# Patient Record
Sex: Female | Born: 1955
Health system: Southern US, Community
[De-identification: ages and names within clinical notes are randomized; demographics above are authoritative.]

## PROBLEM LIST (undated history)

## (undated) DIAGNOSIS — I10 Essential (primary) hypertension: Secondary | ICD-10-CM

## (undated) DIAGNOSIS — B019 Varicella without complication: Secondary | ICD-10-CM

## (undated) HISTORY — PX: LAPAROSCOPIC HYSTERECTOMY: SHX1926

## (undated) HISTORY — DX: Essential (primary) hypertension: I10

## (undated) HISTORY — DX: Varicella without complication: B01.9

---

## 2003-12-19 ENCOUNTER — Emergency Department: Payer: Self-pay | Admitting: Emergency Medicine

## 2007-10-30 ENCOUNTER — Emergency Department: Payer: Self-pay | Admitting: Emergency Medicine

## 2008-02-18 ENCOUNTER — Emergency Department: Payer: Self-pay | Admitting: Emergency Medicine

## 2008-02-18 IMAGING — CR DG CHEST 2V
1 series · 3 of 3 positions shown · non-contrast
Comparison: none

COMMENTS:
REASON FOR EXAM: Chest pain

[Series 1: view not recorded · 0.17mm/px · 3 of 3 slices shown]
[im 1/3]
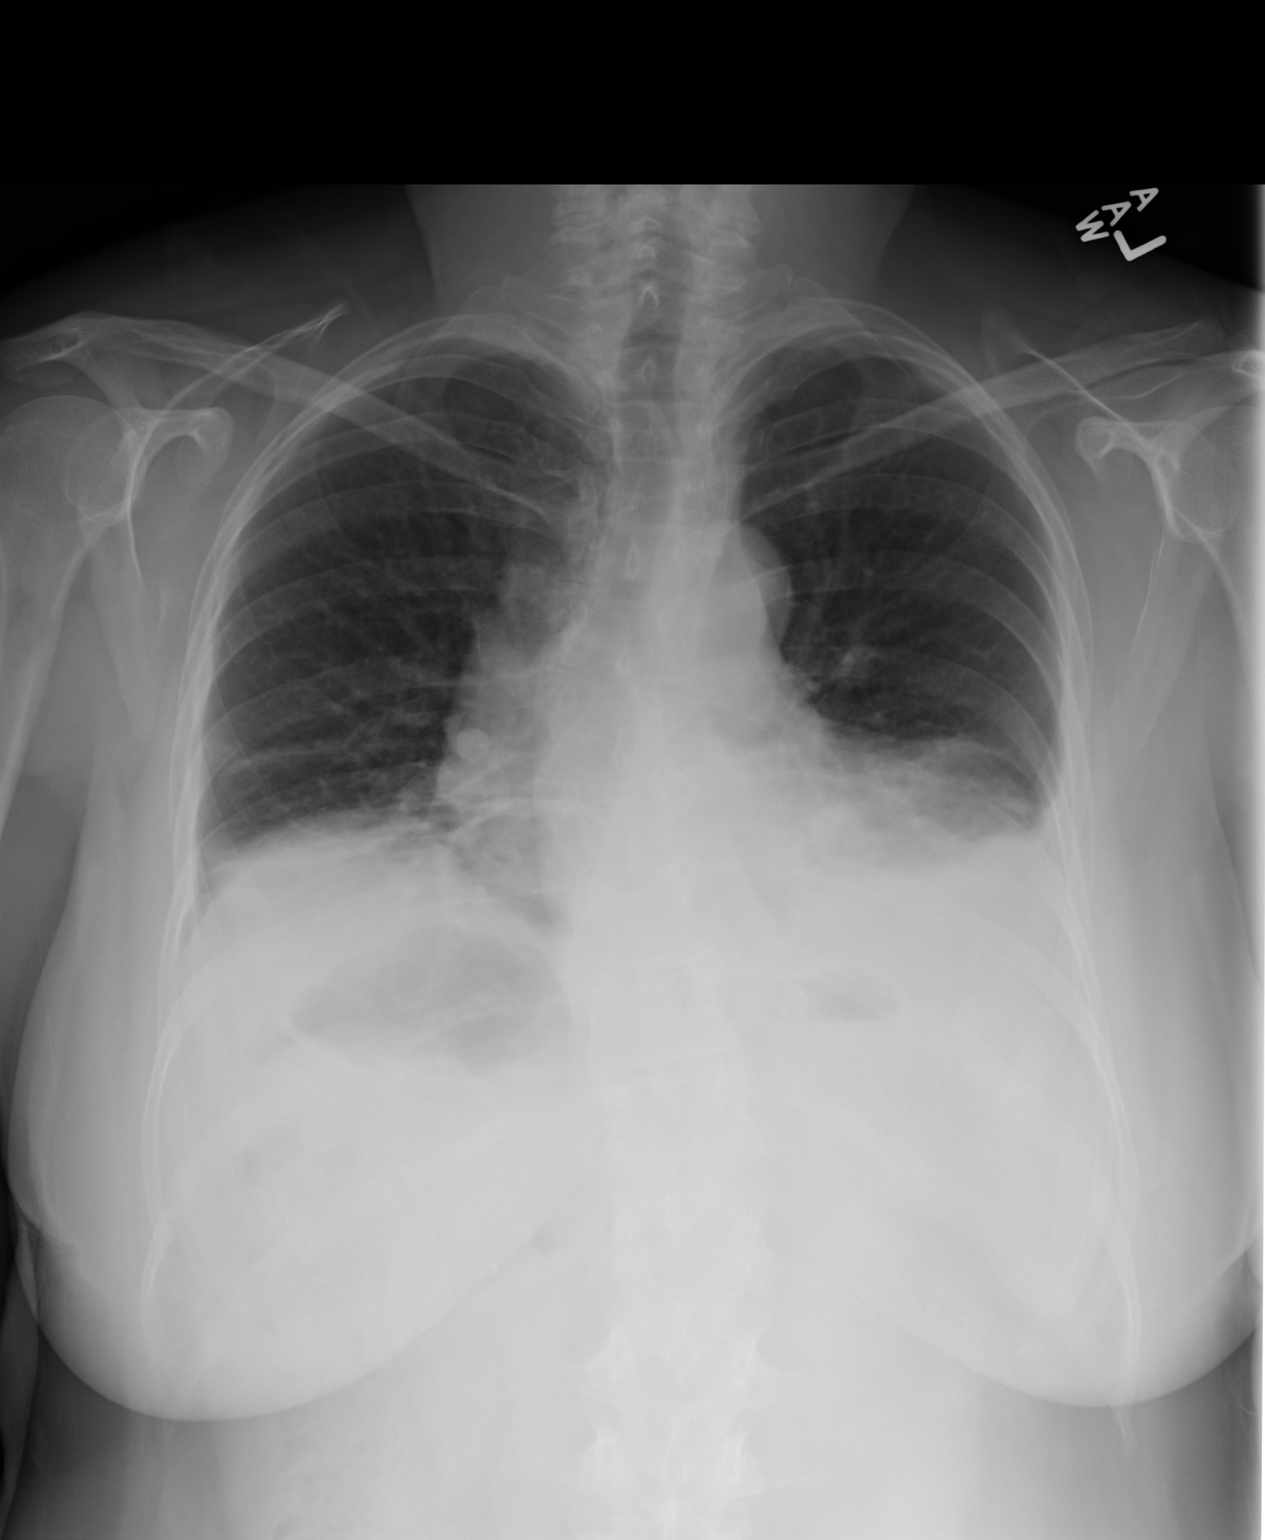
[im 2/3]
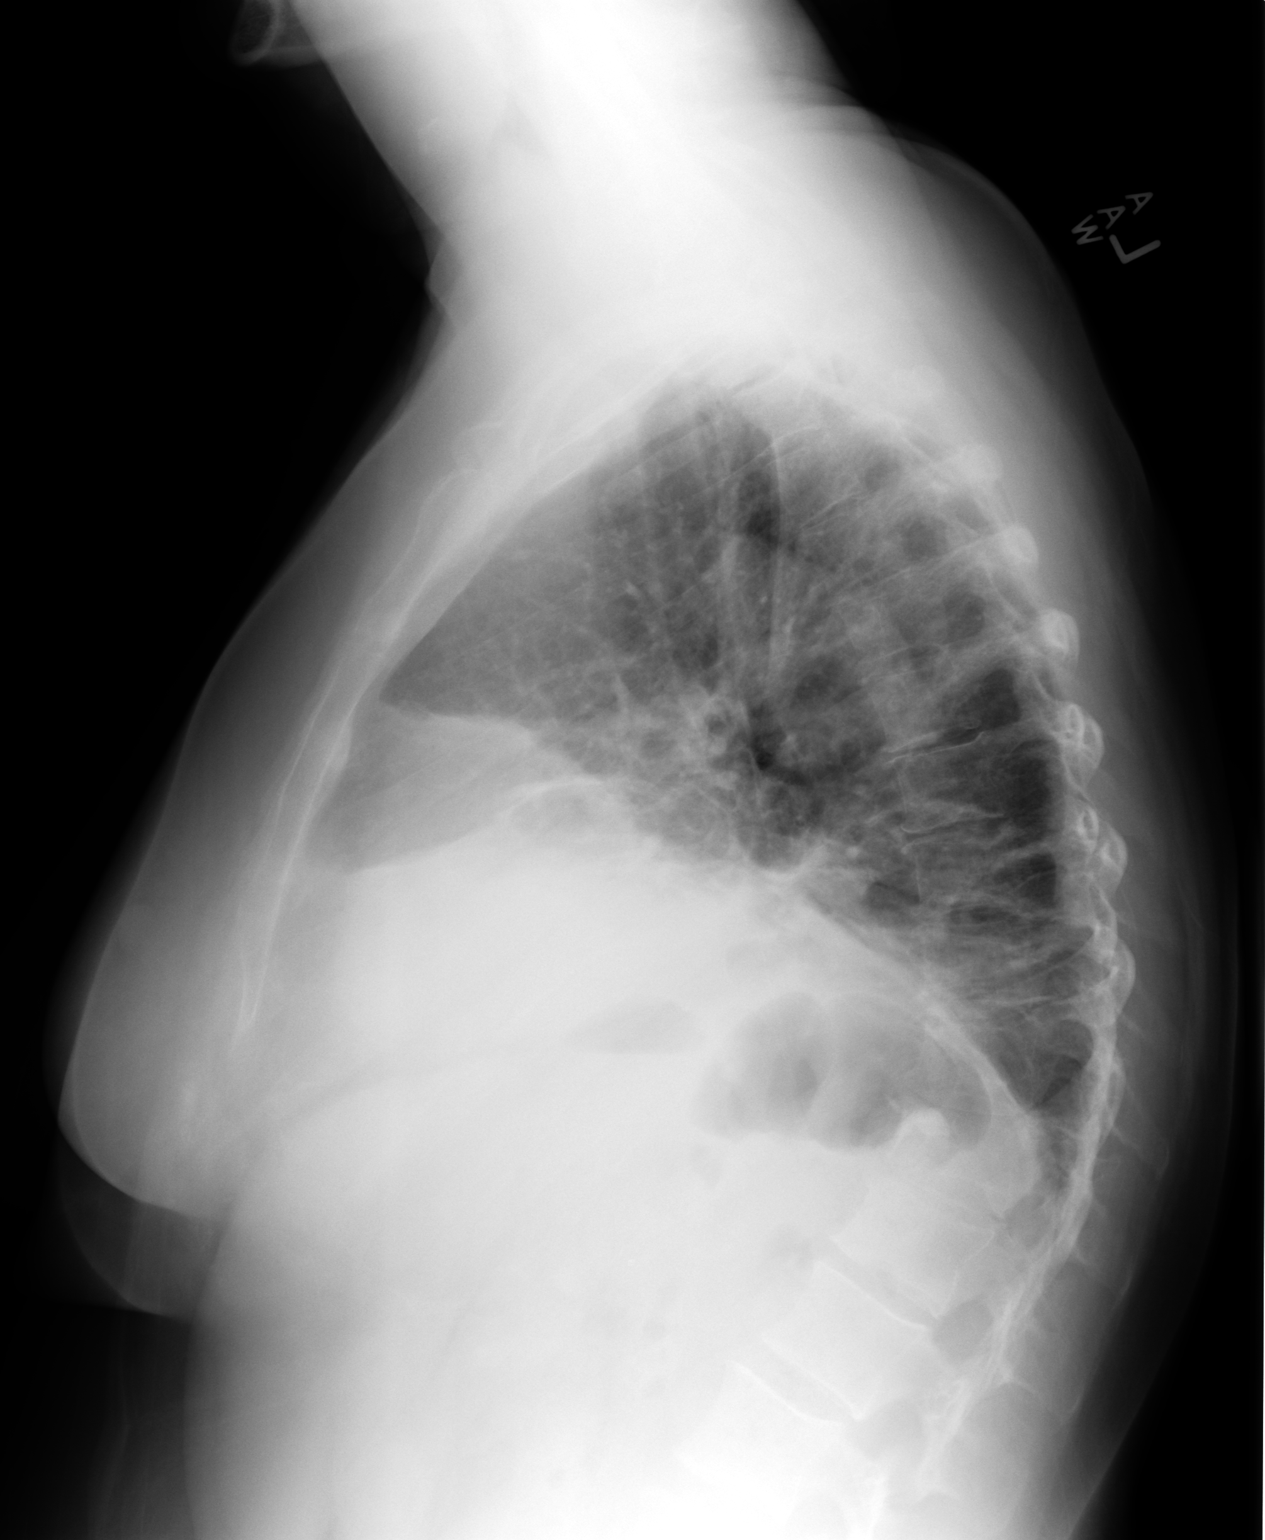
[im 3/3]
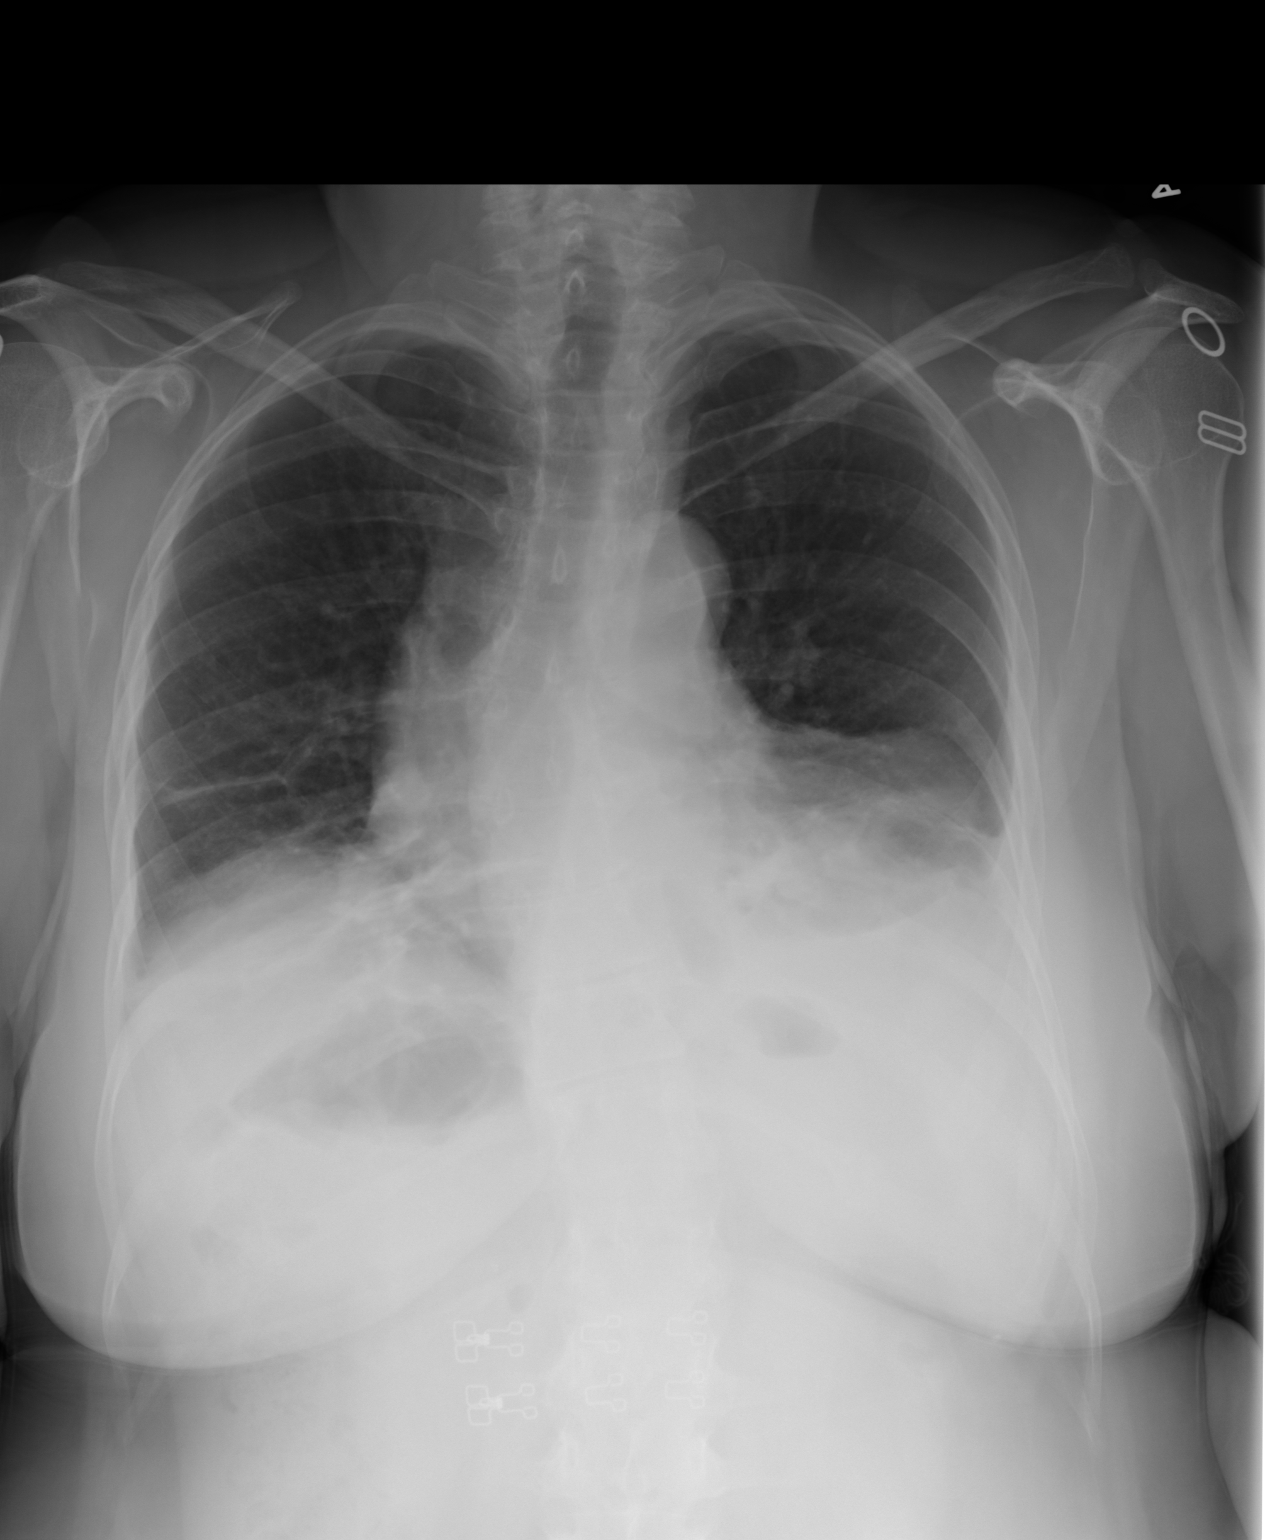

[3 of 3 positions shown; findings below may reference images not displayed]

PROCEDURE:          DXR CHEST PA (OR AP) AND LATERAL [DATE] [DATE]

RESULT:      Comparison is made to the prior exam of [DATE].  There is
increased density at the lung bases bilaterally, more prominent on the LEFT.
The findings are suspicious for bibasilar pneumonia. Associated atelectasis
or a trace LEFT effusion may be present. The heart is enlarged but stable in
size as compared to the prior exam. No interstitial or pulmonary edema is
seen.
IMPRESSION: 1. There are bibasilar infiltrates, more prominent on the LEFT and
compatible with pneumonia and/or atelectasis.
2. Possible small LEFT pleural effusion.
3. Stable cardiomegaly.

## 2008-04-21 ENCOUNTER — Ambulatory Visit: Payer: Self-pay | Admitting: Internal Medicine

## 2010-08-04 ENCOUNTER — Emergency Department: Payer: Self-pay | Admitting: Unknown Physician Specialty

## 2012-10-22 ENCOUNTER — Ambulatory Visit: Payer: Self-pay | Admitting: General Practice

## 2014-11-16 ENCOUNTER — Ambulatory Visit: Payer: Self-pay | Admitting: Family

## 2014-11-16 ENCOUNTER — Encounter: Payer: Self-pay | Admitting: Physician Assistant

## 2014-11-16 VITALS — BP 180/100 | Temp 98.1°F | Wt 162.0 lb

## 2014-11-16 DIAGNOSIS — I10 Essential (primary) hypertension: Secondary | ICD-10-CM

## 2014-11-16 MED ORDER — LOSARTAN POTASSIUM 100 MG PO TABS: 100.0000 mg | ORAL_TABLET | Freq: Every day | ORAL | Status: DC

## 2014-11-16 NOTE — Progress Notes (Signed)
S/ 59 y/o WM referred from benefit [facility] for elevated BP ,states it was  220/120 . She is asx. H/O HTN and not  Compliant to meds, Was seen here in clinic 2012 for HTN and  rx of HCTZ 12.5 She was taken OOW . Also reports 7 yrs ago she was at work with acute onset of visual loss  "like a curtain " in R eye . Lasted 10 minutes. She continued working and had no follow up. Family Hx of HTN and DM Has not had annual eye exam or mammogram since she was 39 .   O/ alert pleasant  NAD  Neck supple without bruits Heart RSR without murmur Lungs clear . Extremities No edema  A/ Hypertension P Cozarr 100 mg one daily.  Referred to [facility] appt 10/12 at 10:30 .  She is encouraged to schedule eye exam and mammogram  And establish care.

## 2014-11-16 NOTE — Patient Instructions (Signed)
Hypertension Hypertension, commonly called high blood pressure, is when the force of blood pumping through your arteries is too strong. Your arteries are the blood vessels that carry blood from your heart throughout your body. A blood pressure reading consists of a higher number over a lower number, such as 110/72. The higher number (systolic) is the pressure inside your arteries when your heart pumps. The lower number (diastolic) is the pressure inside your arteries when your heart relaxes. Ideally you want your blood pressure below 120/80. Hypertension forces your heart to work harder to pump blood. Your arteries may become narrow or stiff. Having untreated or uncontrolled hypertension can cause heart attack, stroke, kidney disease, and other problems. RISK FACTORS Some risk factors for high blood pressure are controllable. Others are not.  Risk factors you cannot control include:   Race. You may be at higher risk if you are African American.  Age. Risk increases with age.  Gender. Men are at higher risk than women before age 45 years. After age 65, women are at higher risk than men. Risk factors you can control include:  Not getting enough exercise or physical activity.  Being overweight.  Getting too much fat, sugar, calories, or salt in your diet.  Drinking too much alcohol. SIGNS AND SYMPTOMS Hypertension does not usually cause signs or symptoms. Extremely high blood pressure (hypertensive crisis) may cause headache, anxiety, shortness of breath, and nosebleed. DIAGNOSIS To check if you have hypertension, your health care provider will measure your blood pressure while you are seated, with your arm held at the level of your heart. It should be measured at least twice using the same arm. Certain conditions can cause a difference in blood pressure between your right and left arms. A blood pressure reading that is higher than normal on one occasion does not mean that you need treatment. If  it is not clear whether you have high blood pressure, you may be asked to return on a different day to have your blood pressure checked again. Or, you may be asked to monitor your blood pressure at home for 1 or more weeks. TREATMENT Treating high blood pressure includes making lifestyle changes and possibly taking medicine. Living a healthy lifestyle can help lower high blood pressure. You may need to change some of your habits. Lifestyle changes may include:  Following the DASH diet. This diet is high in fruits, vegetables, and whole grains. It is low in salt, red meat, and added sugars.  Keep your sodium intake below 2,300 mg per day.  Getting at least 30-45 minutes of aerobic exercise at least 4 times per week.  Losing weight if necessary.  Not smoking.  Limiting alcoholic beverages.  Learning ways to reduce stress. Your health care provider may prescribe medicine if lifestyle changes are not enough to get your blood pressure under control, and if one of the following is true:  You are 18-59 years of age and your systolic blood pressure is above 140.  You are 60 years of age or older, and your systolic blood pressure is above 150.  Your diastolic blood pressure is above 90.  You have diabetes, and your systolic blood pressure is over 140 or your diastolic blood pressure is over 90.  You have kidney disease and your blood pressure is above 140/90.  You have heart disease and your blood pressure is above 140/90. Your personal target blood pressure may vary depending on your medical conditions, your age, and other factors. HOME CARE INSTRUCTIONS    Have your blood pressure rechecked as directed by your health care provider.   Take medicines only as directed by your health care provider. Follow the directions carefully. Blood pressure medicines must be taken as prescribed. The medicine does not work as well when you skip doses. Skipping doses also puts you at risk for  problems.  Do not smoke.   Monitor your blood pressure at home as directed by your health care provider. SEEK MEDICAL CARE IF:   You think you are having a reaction to medicines taken.  You have recurrent headaches or feel dizzy.  You have swelling in your ankles.  You have trouble with your vision. SEEK IMMEDIATE MEDICAL CARE IF:  You develop a severe headache or confusion.  You have unusual weakness, numbness, or feel faint.  You have severe chest or abdominal pain.  You vomit repeatedly.  You have trouble breathing. MAKE SURE YOU:   Understand these instructions.  Will watch your condition.  Will get help right away if you are not doing well or get worse.   This information is not intended to replace advice given to you by your health care provider. Make sure you discuss any questions you have with your health care provider.   Document Released: 01/23/2005 Document Revised: 06/09/2014 Document Reviewed: 11/15/2012 Elsevier Interactive Patient Education 2016 Elsevier Inc. DASH Eating Plan DASH stands for "Dietary Approaches to Stop Hypertension." The DASH eating plan is a healthy eating plan that has been shown to reduce high blood pressure (hypertension). Additional health benefits may include reducing the risk of type 2 diabetes mellitus, heart disease, and stroke. The DASH eating plan may also help with weight loss. WHAT DO I NEED TO KNOW ABOUT THE DASH EATING PLAN? For the DASH eating plan, you will follow these general guidelines:  Choose foods with a percent daily value for sodium of less than 5% (as listed on the food label).  Use salt-free seasonings or herbs instead of table salt or sea salt.  Check with your health care provider or pharmacist before using salt substitutes.  Eat lower-sodium products, often labeled as "lower sodium" or "no salt added."  Eat fresh foods.  Eat more vegetables, fruits, and low-fat dairy products.  Choose whole grains.  Look for the word "whole" as the first word in the ingredient list.  Choose fish and skinless chicken or turkey more often than red meat. Limit fish, poultry, and meat to 6 oz (170 g) each day.  Limit sweets, desserts, sugars, and sugary drinks.  Choose heart-healthy fats.  Limit cheese to 1 oz (28 g) per day.  Eat more home-cooked food and less restaurant, buffet, and fast food.  Limit fried foods.  Cook foods using methods other than frying.  Limit canned vegetables. If you do use them, rinse them well to decrease the sodium.  When eating at a restaurant, ask that your food be prepared with less salt, or no salt if possible. WHAT FOODS CAN I EAT? Seek help from a dietitian for individual calorie needs. Grains Whole grain or whole wheat bread. Brown rice. Whole grain or whole wheat pasta. Quinoa, bulgur, and whole grain cereals. Low-sodium cereals. Corn or whole wheat flour tortillas. Whole grain cornbread. Whole grain crackers. Low-sodium crackers. Vegetables Fresh or frozen vegetables (raw, steamed, roasted, or grilled). Low-sodium or reduced-sodium tomato and vegetable juices. Low-sodium or reduced-sodium tomato sauce and paste. Low-sodium or reduced-sodium canned vegetables.  Fruits All fresh, canned (in natural juice), or frozen fruits. Meat and Other   Protein Products Ground beef (85% or leaner), grass-fed beef, or beef trimmed of fat. Skinless chicken or turkey. Ground chicken or turkey. Pork trimmed of fat. All fish and seafood. Eggs. Dried beans, peas, or lentils. Unsalted nuts and seeds. Unsalted canned beans. Dairy Low-fat dairy products, such as skim or 1% milk, 2% or reduced-fat cheeses, low-fat ricotta or cottage cheese, or plain low-fat yogurt. Low-sodium or reduced-sodium cheeses. Fats and Oils Tub margarines without trans fats. Light or reduced-fat mayonnaise and salad dressings (reduced sodium). Avocado. Safflower, olive, or canola oils. Natural peanut or almond  butter. Other Unsalted popcorn and pretzels. The items listed above may not be a complete list of recommended foods or beverages. Contact your dietitian for more options. WHAT FOODS ARE NOT RECOMMENDED? Grains White bread. White pasta. White rice. Refined cornbread. Bagels and croissants. Crackers that contain trans fat. Vegetables Creamed or fried vegetables. Vegetables in a cheese sauce. Regular canned vegetables. Regular canned tomato sauce and paste. Regular tomato and vegetable juices. Fruits Dried fruits. Canned fruit in light or heavy syrup. Fruit juice. Meat and Other Protein Products Fatty cuts of meat. Ribs, chicken wings, bacon, sausage, bologna, salami, chitterlings, fatback, hot dogs, bratwurst, and packaged luncheon meats. Salted nuts and seeds. Canned beans with salt. Dairy Whole or 2% milk, cream, half-and-half, and cream cheese. Whole-fat or sweetened yogurt. Full-fat cheeses or blue cheese. Nondairy creamers and whipped toppings. Processed cheese, cheese spreads, or cheese curds. Condiments Onion and garlic salt, seasoned salt, table salt, and sea salt. Canned and packaged gravies. Worcestershire sauce. Tartar sauce. Barbecue sauce. Teriyaki sauce. Soy sauce, including reduced sodium. Steak sauce. Fish sauce. Oyster sauce. Cocktail sauce. Horseradish. Ketchup and mustard. Meat flavorings and tenderizers. Bouillon cubes. Hot sauce. Tabasco sauce. Marinades. Taco seasonings. Relishes. Fats and Oils Butter, stick margarine, lard, shortening, ghee, and bacon fat. Coconut, palm kernel, or palm oils. Regular salad dressings. Other Pickles and olives. Salted popcorn and pretzels. The items listed above may not be a complete list of foods and beverages to avoid. Contact your dietitian for more information. WHERE CAN I FIND MORE INFORMATION? National Heart, Lung, and Blood Institute: www.nhlbi.nih.gov/health/health-topics/topics/dash/   This information is not intended to replace  advice given to you by your health care provider. Make sure you discuss any questions you have with your health care provider.   Document Released: 01/12/2011 Document Revised: 02/13/2014 Document Reviewed: 11/27/2012 Elsevier Interactive Patient Education 2016 Elsevier Inc.  

## 2014-11-18 ENCOUNTER — Encounter: Payer: Self-pay | Admitting: Family Medicine

## 2014-11-18 ENCOUNTER — Ambulatory Visit (INDEPENDENT_AMBULATORY_CARE_PROVIDER_SITE_OTHER): Payer: 59 | Admitting: Family Medicine

## 2014-11-18 VITALS — BP 168/98 | HR 74 | Temp 98.6°F | Ht 61.81 in | Wt 162.8 lb

## 2014-11-18 DIAGNOSIS — G44209 Tension-type headache, unspecified, not intractable: Secondary | ICD-10-CM | POA: Diagnosis not present

## 2014-11-18 DIAGNOSIS — R1909 Other intra-abdominal and pelvic swelling, mass and lump: Secondary | ICD-10-CM | POA: Diagnosis not present

## 2014-11-18 DIAGNOSIS — R1310 Dysphagia, unspecified: Secondary | ICD-10-CM | POA: Diagnosis not present

## 2014-11-18 DIAGNOSIS — R51 Headache: Secondary | ICD-10-CM

## 2014-11-18 DIAGNOSIS — I10 Essential (primary) hypertension: Secondary | ICD-10-CM | POA: Diagnosis not present

## 2014-11-18 DIAGNOSIS — R519 Headache, unspecified: Secondary | ICD-10-CM | POA: Insufficient documentation

## 2014-11-18 LAB — BASIC METABOLIC PANEL
BUN: 21 mg/dL (ref 6–23)
CALCIUM: 9.8 mg/dL (ref 8.4–10.5)
CO2: 28 meq/L (ref 19–32)
Chloride: 105 mEq/L (ref 96–112)
Creatinine, Ser: 0.68 mg/dL (ref 0.40–1.20)
GFR: 93.7 mL/min (ref >60.00)
GLUCOSE: 89 mg/dL (ref 70–99)
Potassium: 4.2 mEq/L (ref 3.5–5.1)
SODIUM: 141 meq/L (ref 135–145)

## 2014-11-18 MED ORDER — OMEPRAZOLE 20 MG PO CPDR: 20.0000 mg | DELAYED_RELEASE_CAPSULE | Freq: Every day | ORAL | Status: DC

## 2014-11-18 NOTE — Progress Notes (Signed)
Pre visit review using our clinic review tool, if applicable. No additional management support is needed unless otherwise documented below in the visit note. 

## 2014-11-18 NOTE — Progress Notes (Signed)
Patient ID: Kathryn Cameron, female   DOB: Jun 08, 1955, 59 y.o.   MRN: [id number]  Kathryn AlarEric Sonnenberg, MD Phone: [phone]  Kathryn Cameron is a 59 y.o. female who presents today for new patient visit.  HYPERTENSION Disease Monitoring Home BP Monitoring intermittently checks at work, 150's/80's Chest pain- no    Dyspnea- no Medications Compliance-  Started on cozaar 2 days ago. Lightheadedness-  no  Edema- no Seen at employee health 11/16/14. BP had been 220/120. Came down to 180/100. Started on cozaar.  Notes she has been on HCTZ in the past, though was non-compliant. She repeatedly stated throughout the encounter that she did not like to take medications.   Headache: notes minimal dull HA for the past several days. Has had intermittent infrequent headaches in the past. None recently. Notes it is not bad and barely notes this is there. Gradual onset. Feels this is related to stress at home. No numbness or weakness. No recent vision changes. Notes 7-8 years ago had an episode where she lost vision in her right eye for 10 minutes. It came back to normal and has had no issues since that time. Did not get evaluated for this. Notes tingling in her left little and ring fingers 2 months ago for one week after holding her arm in the same position for an extended period of time. No symptoms since then.   Swallowing trouble: notes has a history of a hiatal hernia. Notes she has trouble with rice and noodles getting stuck going down. No trouble swallowing other food. Has had EGD and colonoscopy in the past that revealed a hiatal hernia. Saw Dr Alycia Rossettiyan at [facility] for this. She states he also evaluated the soft tissue mass in her right groin at that time. She notes she had an US to evaluate this and advised that the US did not show anything abnormal. She notes some intermittent diarrhea, though no constipation. Is always able to pass her bowels. The lesion is soft. Never becomes firm or red. Has grown slightly in the  past several years.    Active Ambulatory Problems    Diagnosis Date Noted  . Essential hypertension 11/18/2014  . Headache 11/18/2014  . Trouble swallowing 11/18/2014  . Mass of right inguinal region 11/18/2014   Resolved Ambulatory Problems    Diagnosis Date Noted  . No Resolved Ambulatory Problems   Past Medical History  Diagnosis Date  . Hypertension   . Chicken pox     Family History  Problem Relation Age of Onset  . Stroke Father     in his 6780's  . Hypertension      parent  . Diabetes      parent    Social History   Social History  . Marital Status: Divorced    Spouse Name: N/A  . Number of Children: N/A  . Years of Education: N/A   Occupational History  . Not on file.   Social History Main Topics  . Smoking status: Never Smoker   . Smokeless tobacco: Not on file  . Alcohol Use: No  . Drug Use: No  . Sexual Activity: Not on file   Other Topics Concern  . Not on file   Social History Narrative    ROS   General:  Negative for nexplained weight loss, fever Skin: Negative for new or changing mole, sore that won't heal HEENT: Negative for trouble hearing, trouble seeing, ringing in ears, mouth sores, hoarseness, change in voice, dysphagia. CV:  Negative  for chest pain, dyspnea, edema, palpitations Resp: Negative for cough, dyspnea, hemoptysis GI: positive for trouble swallowing, Negative for nausea, vomiting, diarrhea, constipation, abdominal pain, melena, hematochezia. GU: Negative for dysuria, incontinence, urinary hesitance, hematuria, vaginal or penile discharge, polyuria, sexual difficulty, lumps in testicle or breasts MSK: Negative for muscle cramps or aches, joint pain or swelling Neuro: positive for headaches, Negative for weakness, numbness, dizziness, passing out/fainting Psych: Negative for depression, anxiety, memory problems  Objective  Physical Exam Filed Vitals:   11/18/14 1139  BP: 168/98  Pulse:   Temp:     BP Readings from  Last 3 Encounters:  11/18/14 168/98  11/16/14 180/100   Wt Readings from Last 3 Encounters:  11/18/14 162 lb 12.8 oz (73.846 kg)  11/16/14 162 lb (73.483 kg)    Physical Exam  Constitutional: She is well-developed, well-nourished, and in no distress.  HENT:  Head: Normocephalic and atraumatic.  Right Ear: External ear normal.  Left Ear: External ear normal.  Mouth/Throat: Oropharynx is clear and moist. No oropharyngeal exudate.  Eyes: Conjunctivae are normal. Pupils are equal, round, and reactive to light.  Neck: Neck supple.  Cardiovascular: Normal rate, regular rhythm and normal heart sounds.  Exam reveals no gallop and no friction rub.   No murmur heard. Pulmonary/Chest: Effort normal and breath sounds normal. No respiratory distress. She has no wheezes. She has no rales.  Abdominal: Soft. Bowel sounds are normal. She exhibits no distension and no mass. There is no tenderness. There is no rebound and no guarding.  No pulsatile mass  Genitourinary:  Normal labia, there is evidence of small degree of anterior wall of vagina prolapse, no cervix identified in patient s/p hysterectomy, normal vaginal mucosa with no bleeding or discharge, the area of prolapse worsens with prolapse In the right inguinal area there is a large soft tissue mass noted, this does not worsen with cough, there is no palpable hernia on exam, there is no erythema or tenderness, no induration, no LAD in bilateral inguinal regions  Musculoskeletal: She exhibits no edema.  Lymphadenopathy:    She has no cervical adenopathy.  Neurological: She is alert.  CN 2-12 intact, 5/5 strength in bilateral biceps, triceps, grip, quads, hamstrings, plantar and dorsiflexion, sensation to light touch intact in bilateral UE and LE, normal gait, 2+ patellar reflexes  Skin: Skin is warm and dry. She is not diaphoretic.  Psychiatric: Mood and affect normal.     Assessment/Plan:   Essential hypertension BP elevated today. Mild  headache is only symptom. No signs of end organ damage. Suspect it has been elevated for a long time given she has not seen a physician in a number of years. Patient is hesitant to take medication. Has only been on cozaar for 2 days. Will continue to monitor on this. Will check BMET to determine if kidneys or liver has been affected. Return early next week for BP check. Given return precautions.   Headache Mild dull headache consistent with tension HA. Could be related to BP. Neurologically intact. Discussed that onset of headaches in >16 yo is concerning given that she had infrequent headaches previously. Discussed imaging to evaluate her brain, though she declined this opting to see if this would improve with blood pressure control. Given return precautions.   Trouble swallowing Patient with history of hiatal hernia and prior GI evaluation. Has continue to have issues with this. No reported weight loss. No blood in stool. Is able to swallow most things well. Concern would be for some  type of stricture or worsening of hiatal hernia. Will refer to GI for further evaluation. Start on omeprazole. Given return precautions.   Mass of right inguinal region Lesion is soft and feels consistent with lipoma. No apparent hernia on exam. No LAD. No abdominal pain. Reports daily BMs making hernia and obstruction unlikely. Reports she had a prior eval for this with [place], though the lesion has enlarged some. Pelvic exam with evidence of likely bladder prolapse as well. No apparent other abnormalities. Discussed possible causes of this. Advised that CT scan or referral to surgery for the inguinal mass and referral to GYN for the prolapse would be what I would like to do to further evaluate these issues, though she declined these stating she would like to think about this. She would prefer to monitor these things at this time. She will let us know if she decides to pursue further work up. Given return precautions.      Orders Placed This Encounter  Procedures  . Basic Metabolic Panel (BMET)  . Ambulatory referral to Gastroenterology    Referral Priority:  Routine    Referral Type:  Consultation    Referral Reason:  Specialty Services Required    Number of Visits Requested:  1    Meds ordered this encounter  Medications  . omeprazole (PRILOSEC) 20 MG capsule    Sig: Take 1 capsule (20 mg total) by mouth daily.    Dispense:  30 capsule    Refill:  3    Kathryn Alar

## 2014-11-18 NOTE — Assessment & Plan Note (Signed)
Mild dull headache consistent with tension HA. Could be related to BP. Neurologically intact. Discussed that onset of headaches in 19>60 yo is concerning given that she had infrequent headaches previously. Discussed imaging to evaluate her brain, though she declined this opting to see if this would improve with blood pressure control. Given return precautions.

## 2014-11-18 NOTE — Assessment & Plan Note (Signed)
BP elevated today. Mild headache is only symptom. No signs of end organ damage. Suspect it has been elevated for a long time given she has not seen a physician in a number of years. Patient is hesitant to take medication. Has only been on cozaar for 2 days. Will continue to monitor on this. Will check BMET to determine if kidneys or liver has been affected. Return early next week for BP check. Given return precautions.

## 2014-11-18 NOTE — Assessment & Plan Note (Signed)
Patient with history of hiatal hernia and prior GI evaluation. Has continue to have issues with this. No reported weight loss. No blood in stool. Is able to swallow most things well. Concern would be for some type of stricture or worsening of hiatal hernia. Will refer to GI for further evaluation. Start on omeprazole. Given return precautions.

## 2014-11-18 NOTE — Patient Instructions (Signed)
Nice to meet you. Please check your blood pressure daily at home.  Please continue the cozaar for your blood pressure. Please come back early next week for a nurse visit for blood pressure check. We will start you on omeprazole. We will refer you to GI.  Please consider obtaining a CT scan or seeing surgery for the mass in your groin.  Please consider seeing gynecology for your vaginal prolapse.  If you decide to do the above things please let us know.  If you have chest pain, shortness of breath, swelling, abdominal pain, nausea, vomiting, diarrhea, headache, vision changes, numbness, or weakness please seek medical attention.

## 2014-11-18 NOTE — Assessment & Plan Note (Signed)
Lesion is soft and feels consistent with lipoma. No apparent hernia on exam. No LAD. No abdominal pain. Reports daily BMs making hernia and obstruction unlikely. Reports she had a prior eval for this with US, though the lesion has enlarged some. Pelvic exam with evidence of likely bladder prolapse as well. No apparent other abnormalities. Discussed possible causes of this. Advised that CT scan or referral to surgery for the inguinal mass and referral to GYN for the prolapse would be what I would like to do to further evaluate these issues, though she declined these stating she would like to think about this. She would prefer to monitor these things at this time. She will let us know if she decides to pursue further work up. Given return precautions.

## 2014-11-26 ENCOUNTER — Ambulatory Visit (INDEPENDENT_AMBULATORY_CARE_PROVIDER_SITE_OTHER): Payer: 59

## 2014-11-26 VITALS — BP 130/78 | HR 78 | Resp 20

## 2014-11-26 DIAGNOSIS — I10 Essential (primary) hypertension: Secondary | ICD-10-CM | POA: Diagnosis not present

## 2014-11-26 MED ORDER — LOSARTAN POTASSIUM 100 MG PO TABS: 100.0000 mg | ORAL_TABLET | Freq: Every day | ORAL | Status: DC

## 2014-11-26 NOTE — Progress Notes (Signed)
Blood pressure is at goal today. Will continue to monitor. If her blood pressure continues to be >140 SBP at work she should inform the office. She should check her blood pressure after she has been resting for 10-15 minutes. I will refill her losartan. She should come back in for another BMET to ensure her kidney function is stable on the losartan.

## 2014-11-26 NOTE — Addendum Note (Signed)
Addended by: Birdie SonsSONNENBERG, Chandrea Zellman G on: 11/26/2014 01:54 PM   Modules accepted: Orders

## 2014-11-26 NOTE — Progress Notes (Signed)
Patient came in for BP recheck.  Has been checking her BP at work and getting 130-150's SBP.  Checked BP in office in bilateral extremities.    Patient needs a refill on her Losartan Potassium 100mg  90 day supply, [facility]  Please advise?

## 2015-04-08 ENCOUNTER — Other Ambulatory Visit: Payer: Self-pay | Admitting: *Deleted

## 2015-04-08 DIAGNOSIS — I10 Essential (primary) hypertension: Secondary | ICD-10-CM

## 2015-04-08 NOTE — Telephone Encounter (Signed)
[id number]

## 2015-04-08 NOTE — Telephone Encounter (Signed)
Patient has requested a medication refill for losartan Pharmacy [facility]

## 2015-04-09 NOTE — Telephone Encounter (Signed)
Pt is requesting a refill on her loartan. Pt last OV 11/27/14, last filled 11/26/14. Pt has not had a 3 month f/u as requested by Dr. Birdie SonsSonnenberg. Please advise, thanks

## 2015-04-09 NOTE — Telephone Encounter (Signed)
I will ok losartan #30 with no refills.  She needs a f/u appt with him scheduled.  Also needs f/u met b since started losartan.  Please schedule and then can send in rx for #30 with no refills.

## 2015-04-12 NOTE — Telephone Encounter (Signed)
Called unable to leave message

## 2015-04-13 NOTE — Telephone Encounter (Signed)
[id number]

## 2018-08-06 DIAGNOSIS — H109 Unspecified conjunctivitis: Secondary | ICD-10-CM | POA: Diagnosis not present

## 2018-08-06 DIAGNOSIS — H60542 Acute eczematoid otitis externa, left ear: Secondary | ICD-10-CM | POA: Diagnosis not present

## 2018-08-09 DIAGNOSIS — H0100A Unspecified blepharitis right eye, upper and lower eyelids: Secondary | ICD-10-CM | POA: Diagnosis not present

## 2018-08-09 DIAGNOSIS — I10 Essential (primary) hypertension: Secondary | ICD-10-CM | POA: Diagnosis not present

## 2018-08-09 DIAGNOSIS — H0100B Unspecified blepharitis left eye, upper and lower eyelids: Secondary | ICD-10-CM | POA: Diagnosis not present

## 2019-06-08 ENCOUNTER — Observation Stay
Admission: EM | Admit: 2019-06-08 | Discharge: 2019-06-09 | Disposition: A | Discharge status: Home / Self Care | Payer: 59 | Attending: General Surgery | Admitting: General Surgery

## 2019-06-08 ENCOUNTER — Other Ambulatory Visit: Payer: Self-pay

## 2019-06-08 ENCOUNTER — Encounter: Payer: Self-pay | Admitting: Emergency Medicine

## 2019-06-08 ENCOUNTER — Emergency Department: Payer: 59

## 2019-06-08 DIAGNOSIS — K429 Umbilical hernia without obstruction or gangrene: Secondary | ICD-10-CM | POA: Insufficient documentation

## 2019-06-08 DIAGNOSIS — E872 Acidosis: Secondary | ICD-10-CM | POA: Insufficient documentation

## 2019-06-08 DIAGNOSIS — N8110 Cystocele, unspecified: Secondary | ICD-10-CM | POA: Diagnosis not present

## 2019-06-08 DIAGNOSIS — Z20822 Contact with and (suspected) exposure to covid-19: Secondary | ICD-10-CM | POA: Insufficient documentation

## 2019-06-08 DIAGNOSIS — K4030 Unilateral inguinal hernia, with obstruction, without gangrene, not specified as recurrent: Secondary | ICD-10-CM | POA: Insufficient documentation

## 2019-06-08 DIAGNOSIS — I700 Atherosclerosis of aorta: Secondary | ICD-10-CM | POA: Insufficient documentation

## 2019-06-08 DIAGNOSIS — Z79899 Other long term (current) drug therapy: Secondary | ICD-10-CM | POA: Insufficient documentation

## 2019-06-08 DIAGNOSIS — K4090 Unilateral inguinal hernia, without obstruction or gangrene, not specified as recurrent: Secondary | ICD-10-CM | POA: Diagnosis not present

## 2019-06-08 DIAGNOSIS — K460 Unspecified abdominal hernia with obstruction, without gangrene: Secondary | ICD-10-CM

## 2019-06-08 DIAGNOSIS — Z886 Allergy status to analgesic agent status: Secondary | ICD-10-CM | POA: Diagnosis not present

## 2019-06-08 DIAGNOSIS — I10 Essential (primary) hypertension: Secondary | ICD-10-CM | POA: Insufficient documentation

## 2019-06-08 DIAGNOSIS — K449 Diaphragmatic hernia without obstruction or gangrene: Secondary | ICD-10-CM | POA: Diagnosis not present

## 2019-06-08 DIAGNOSIS — Z03818 Encounter for observation for suspected exposure to other biological agents ruled out: Secondary | ICD-10-CM | POA: Diagnosis not present

## 2019-06-08 DIAGNOSIS — K56609 Unspecified intestinal obstruction, unspecified as to partial versus complete obstruction: Secondary | ICD-10-CM | POA: Diagnosis not present

## 2019-06-08 DIAGNOSIS — K469 Unspecified abdominal hernia without obstruction or gangrene: Secondary | ICD-10-CM | POA: Diagnosis present

## 2019-06-08 LAB — COMPREHENSIVE METABOLIC PANEL
ALT: 25 U/L (ref 0–44)
AST: 25 U/L (ref 15–41)
Albumin: 4.5 g/dL (ref 3.5–5.0)
Alkaline Phosphatase: 115 U/L (ref 38–126)
Anion gap: 12 (ref 5–15)
BUN: 21 mg/dL (ref 8–23)
CO2: 26 mmol/L (ref 22–32)
Calcium: 9.5 mg/dL (ref 8.9–10.3)
Chloride: 99 mmol/L (ref 98–111)
Creatinine, Ser: 0.68 mg/dL (ref 0.44–1.00)
GFR calc Af Amer: >60 mL/min (ref >60)
GFR calc non Af Amer: >60 mL/min (ref >60)
Glucose, Bld: 197 mg/dL — ABNORMAL HIGH (ref 70–99)
Potassium: 3.4 mmol/L — ABNORMAL LOW (ref 3.5–5.1)
Sodium: 137 mmol/L (ref 135–145)
Total Bilirubin: 1 mg/dL (ref 0.3–1.2)
Total Protein: 8 g/dL (ref 6.5–8.1)

## 2019-06-08 LAB — LACTIC ACID, PLASMA
Lactic Acid, Venous: 2.1 mmol/L (ref 0.5–1.9)
Lactic Acid, Venous: 1.7 mmol/L (ref 0.5–1.9)

## 2019-06-08 LAB — RESPIRATORY PANEL BY RT PCR (FLU A&B, COVID)
Influenza A by PCR: NEGATIVE
Influenza B by PCR: NEGATIVE
SARS Coronavirus 2 by RT PCR: NEGATIVE

## 2019-06-08 LAB — CBC
HCT: 49.3 % — ABNORMAL HIGH (ref 36.0–46.0)
Hemoglobin: 16.5 g/dL — ABNORMAL HIGH (ref 12.0–15.0)
MCH: 29.6 pg (ref 26.0–34.0)
MCHC: 33.5 g/dL (ref 30.0–36.0)
MCV: 88.4 fL (ref 80.0–100.0)
Platelets: 294 10*3/uL (ref 150–400)
RBC: 5.58 MIL/uL — ABNORMAL HIGH (ref 3.87–5.11)
RDW: 12.9 % (ref 11.5–15.5)
WBC: 8.8 10*3/uL (ref 4.0–10.5)
nRBC: 0 % (ref 0.0–0.2)

## 2019-06-08 LAB — LIPASE, BLOOD: Lipase: 19 U/L (ref 11–51)

## 2019-06-08 MED ORDER — IOHEXOL 300 MG/ML  SOLN
100.0000 mL | Freq: Once | INTRAMUSCULAR | Status: AC | PRN
  Administered 2019-06-08: 100 mL via INTRAVENOUS

## 2019-06-08 MED ORDER — OXYCODONE HCL 5 MG PO TABS: 5.0000 mg | ORAL_TABLET | ORAL | Status: DC | PRN

## 2019-06-08 MED ORDER — SIMETHICONE 80 MG PO CHEW
40.0000 mg | CHEWABLE_TABLET | Freq: Four times a day (QID) | ORAL | Status: DC | PRN
  Filled 2019-06-08: qty 1

## 2019-06-08 MED ORDER — ONDANSETRON 4 MG PO TBDP: 4.0000 mg | ORAL_TABLET | Freq: Four times a day (QID) | ORAL | Status: DC | PRN

## 2019-06-08 MED ORDER — METOPROLOL TARTRATE 5 MG/5ML IV SOLN
5.0000 mg | Freq: Four times a day (QID) | INTRAVENOUS | Status: DC | PRN
  Administered 2019-06-08: 5 mg via INTRAVENOUS
  Filled 2019-06-08: qty 5

## 2019-06-08 MED ORDER — ONDANSETRON HCL 4 MG/2ML IJ SOLN: 4.0000 mg | Freq: Four times a day (QID) | INTRAMUSCULAR | Status: DC | PRN

## 2019-06-08 MED ORDER — HYDROMORPHONE HCL 1 MG/ML IJ SOLN: 0.5000 mg | INTRAMUSCULAR | Status: DC | PRN

## 2019-06-08 MED ORDER — KCL IN DEXTROSE-NACL 20-5-0.45 MEQ/L-%-% IV SOLN
INTRAVENOUS | Status: DC
  Filled 2019-06-08 (×3): qty 1000

## 2019-06-08 MED ORDER — MORPHINE SULFATE (PF) 4 MG/ML IV SOLN
4.0000 mg | Freq: Once | INTRAVENOUS | Status: AC
  Administered 2019-06-08: 4 mg via INTRAVENOUS
  Filled 2019-06-08: qty 1

## 2019-06-08 MED ORDER — LACTATED RINGERS IV BOLUS
1000.0000 mL | Freq: Once | INTRAVENOUS | Status: AC
  Administered 2019-06-08: 16:00:00 1000 mL via INTRAVENOUS

## 2019-06-08 MED ORDER — ENOXAPARIN SODIUM 40 MG/0.4ML ~~LOC~~ SOLN
40.0000 mg | SUBCUTANEOUS | Status: DC
  Administered 2019-06-08: 16:00:00 40 mg via SUBCUTANEOUS
  Filled 2019-06-08: qty 0.4

## 2019-06-08 MED ORDER — ONDANSETRON HCL 4 MG/2ML IJ SOLN
4.0000 mg | Freq: Once | INTRAMUSCULAR | Status: AC
  Administered 2019-06-08: 11:00:00 4 mg via INTRAVENOUS
  Filled 2019-06-08: qty 2

## 2019-06-08 MED ORDER — ACETAMINOPHEN 500 MG PO TABS
1000.0000 mg | ORAL_TABLET | Freq: Four times a day (QID) | ORAL | Status: DC
  Administered 2019-06-08 – 2019-06-09 (×3): 1000 mg via ORAL
  Filled 2019-06-08 (×3): qty 2

## 2019-06-08 MED ORDER — HYDRALAZINE HCL 20 MG/ML IJ SOLN
10.0000 mg | Freq: Four times a day (QID) | INTRAMUSCULAR | Status: DC | PRN
  Administered 2019-06-09: 10 mg via INTRAVENOUS
  Filled 2019-06-08: qty 1

## 2019-06-08 MED ORDER — MORPHINE SULFATE (PF) 4 MG/ML IV SOLN
4.0000 mg | Freq: Once | INTRAVENOUS | Status: AC
  Administered 2019-06-08: 11:00:00 4 mg via INTRAVENOUS
  Filled 2019-06-08: qty 1

## 2019-06-08 MED ORDER — POLYETHYLENE GLYCOL 3350 17 G PO PACK: 17.0000 g | PACK | Freq: Every day | ORAL | Status: DC | PRN

## 2019-06-08 MED ORDER — KETOROLAC TROMETHAMINE 30 MG/ML IJ SOLN
30.0000 mg | Freq: Four times a day (QID) | INTRAMUSCULAR | Status: DC | PRN
  Administered 2019-06-09: 30 mg via INTRAVENOUS
  Filled 2019-06-08: qty 1

## 2019-06-08 MED ORDER — PANTOPRAZOLE SODIUM 40 MG PO TBEC
40.0000 mg | DELAYED_RELEASE_TABLET | Freq: Every day | ORAL | Status: DC
  Administered 2019-06-08 – 2019-06-09 (×2): 40 mg via ORAL
  Filled 2019-06-08 (×2): qty 1

## 2019-06-08 NOTE — ED Notes (Signed)
Pt's daughter went home.

## 2019-06-08 NOTE — ED Notes (Signed)
Pt's daughter at bedside w/ permission from Pt.

## 2019-06-08 NOTE — ED Notes (Signed)
Pt's O2 sats dropped to mid 80's, placed pt on 4L [place]. Pt c/o pain w/ breathing.

## 2019-06-08 NOTE — Progress Notes (Signed)
Briefly, this is a 64 y/o F who presented to the ED with abdominal pain. Evaluation in the ED included a CT scan which showed multiple hernias, including a large right-sided hernia potentially causing a bowel obstruction and imaging findings concerning for incarceration.  The ED physician, Dr. Cyril Loosen, was able to reduce both right and left-sided inguinal hernias, however lactic acid was mildly elevated at 2.1.  We will admit Kathryn Cameron for monitoring and to confirm the hernias remain reducible and lactic acid improves.  She will require repair of her hernias, but at this time, it is not emergent.  Full H&P to follow.

## 2019-06-08 NOTE — ED Triage Notes (Signed)
Pt presents via EMS c/o abd pain since this am.

## 2019-06-08 NOTE — ED Notes (Signed)
Date and time results received: 06/08/19 1440 (use smartphrase ".now" to insert current time)  Test: lactic  Critical Value: 2.1   Name of Provider Notified: Dr Cyril Loosen  Orders Received? Or Actions Taken?: Actions Taken: No new orders.

## 2019-06-08 NOTE — ED Provider Notes (Signed)
[facility] Emergency Department Provider Note   ____________________________________________    I have reviewed the triage vital signs and the nursing notes.   HISTORY  Chief Complaint Abdominal Pain     HPI Kathryn Cameron is a 64 y.o. female who presents with complaints of diffuse abdominal pain.  Patient reports she has not been able tolerate p.o.'s over the last 2 days.  Has had significant nausea.  Denies fevers or chills.  Complains of abdominal distention diffuse cramping abdominal pain.  Has not passed gas in over a day.  No recent stools.  Has not take anything for this.  No history of the same.  Does have a history of a laparoscopic hysterectomy.  Review of records demonstrates that she has a known hernia.  Patient reports she does not like seeing doctors so does so infrequently  Past Medical History:  Diagnosis Date  . Chicken pox   . Hypertension     Patient Active Problem List   Diagnosis Date Noted  . Hernia of abdominal cavity 06/08/2019  . Essential hypertension 11/18/2014  . Headache 11/18/2014  . Trouble swallowing 11/18/2014  . Mass of right inguinal region 11/18/2014    Past Surgical History:  Procedure Laterality Date  . LAPAROSCOPIC HYSTERECTOMY     left one ovary in place    Prior to Admission medications   Medication Sig Start Date End Date Taking? Authorizing Provider  hydrochlorothiazide (HYDRODIURIL) 25 MG tablet Take by mouth. 08/09/18 08/09/19 Yes [provider]  triamcinolone cream (KENALOG) 0.1 % Apply topically. 08/06/18 08/06/19 Yes [provider]  losartan (COZAAR) 100 MG tablet Take 1 tablet (100 mg total) by mouth daily. 11/26/14   Glori Luis, MD  omeprazole (PRILOSEC) 20 MG capsule Take 1 capsule (20 mg total) by mouth daily. 11/18/14   Glori Luis, MD     Allergies Naproxen  Family History  Problem Relation Age of Onset  . Stroke Father        in his 62's  .  Hypertension Other        parent  . Diabetes Other        parent    Social History Social History   Tobacco Use  . Smoking status: Never Smoker  Substance Use Topics  . Alcohol use: No    Alcohol/week: 0.0 standard drinks  . Drug use: No    Review of Systems  Constitutional: No fever/chills Eyes: No visual changes.  ENT: No sore throat. Cardiovascular: Denies chest pain. Respiratory: Denies shortness of breath. Gastrointestinal: As above Genitourinary: Negative for dysuria. Musculoskeletal: Negative for back pain. Skin: Negative for rash. Neurological: Negative for headaches    ____________________________________________   PHYSICAL EXAM:  VITAL SIGNS: ED Triage Vitals  Enc Vitals Group     BP 06/08/19 1049 (!) 236/105     Pulse Rate 06/08/19 1049 67     Resp 06/08/19 1049 16     Temp 06/08/19 1049 97.7 F (36.5 C)     Temp Source 06/08/19 1049 Oral     SpO2 06/08/19 1049 94 %     Weight 06/08/19 1057 74.8 kg (165 lb)     Height 06/08/19 1057 1.6 m (5\' 3" )     Head Circumference --      Peak Flow --      Pain Score 06/08/19 1049 10     Pain Loc --      Pain Edu? --  Excl. in [place]? --     Constitutional: Alert and oriented.  Appears to be in pain Eyes: Conjunctivae are normal.  Head: Atraumatic. Nose: No congestion/rhinnorhea.  Cardiovascular: Normal rate, regular rhythm. Grossly normal heart sounds.  Good peripheral circulation. Respiratory: Normal respiratory effort.  No retractions. Lungs CTAB. Gastrointestinal: Tenderness to palpation primarily in the upper abdomen, mild to moderate distention, bilateral inguinal hernias noted, not overly tender to palpation, no erythema  Musculoskeletal:  Warm and well perfused Neurologic:  Normal speech and language. No gross focal neurologic deficits are appreciated.  Skin:  Skin is warm, dry and intact. No rash noted. Psychiatric: Mood and affect are normal. Speech and behavior are  normal.  ____________________________________________   LABS (all labs ordered are listed, but only abnormal results are displayed)  Labs Reviewed  CBC - Abnormal; Notable for the following components:      Result Value   RBC 5.58 (*)    Hemoglobin 16.5 (*)    HCT 49.3 (*)    All other components within normal limits  COMPREHENSIVE METABOLIC PANEL - Abnormal; Notable for the following components:   Potassium 3.4 (*)    Glucose, Bld 197 (*)    All other components within normal limits  LACTIC ACID, PLASMA - Abnormal; Notable for the following components:   Lactic Acid, Venous 2.1 (*)    All other components within normal limits  RESPIRATORY PANEL BY RT PCR (FLU A&B, COVID)  LIPASE, BLOOD  URINALYSIS, COMPLETE (UACMP) WITH MICROSCOPIC  LACTIC ACID, PLASMA  HIV ANTIBODY (ROUTINE TESTING W REFLEX)  CBC  CREATININE, SERUM   ____________________________________________  EKG  None ____________________________________________  RADIOLOGY  CT abdomen pelvis demonstrates small bowel obstruction due to multiple loops of ileum and a large right inguinal hernia, bowel wall thickening and mesenteric edema concerning for incarceration no evidence of perforation. ____________________________________________   PROCEDURES  Procedure(s) performed: No  Hernia reduction  Date/Time: 06/08/2019 2:10 PM Performed by: Lavonia Drafts, MD Authorized by: Lavonia Drafts, MD  Consent: Verbal consent obtained. Risks and benefits: risks, benefits and alternatives were discussed Consent given by: patient Patient understanding: patient states understanding of the procedure being performed Patient identity confirmed: verbally with patient Local anesthesia used: no  Anesthesia: Local anesthesia used: no  Sedation: Patient sedated: no  Patient tolerance: patient tolerated the procedure well with no immediate complications      Critical Care performed:yes  CRITICAL CARE Performed by:  Lavonia Drafts   Total critical care time: 30 minutes  Critical care time was exclusive of separately billable procedures and treating other patients.  Critical care was necessary to treat or prevent imminent or life-threatening deterioration.  Critical care was time spent personally by me on the following activities: development of treatment plan with patient and/or surrogate as well as nursing, discussions with consultants, evaluation of patient's response to treatment, examination of patient, obtaining history from patient or surrogate, ordering and performing treatments and interventions, ordering and review of laboratory studies, ordering and review of radiographic studies, pulse oximetry and re-evaluation of patient's condition.  ____________________________________________   INITIAL IMPRESSION / ASSESSMENT AND PLAN / ED COURSE  Pertinent labs & imaging results that were available during my care of the patient were reviewed by me and considered in my medical decision making (see chart for details).  Patient presents with diffuse abdominal pain, worse in the upper abdomen the lower abdomen.  Hernias noted although not significantly tender to palpation.  Differential includes small bowel obstruction, hernia incarceration, enteritis, gastritis.  We will give IV morphine, IV Zofran while we await labs.  Lab work is overall reassuring, white blood cell count is normal.  BUN to creatinine ratio is normal.  LFTs are unremarkable.  Lipase is normal.  Sent for CT abdomen pelvis to evaluate further  Contacted by radiologist and notified of small bowel obstruction due to hernia with possible incarceration, free fluid  Spoke with Dr. Lady Gary of surgery who recommends attempting to reduce while she reviews the CT.  Reduction performed by me successfully, patient tolerated well.  Dr. Lady Gary will admit the patient    ____________________________________________   FINAL CLINICAL  IMPRESSION(S) / ED DIAGNOSES  Final diagnoses:  Small bowel obstruction (HCC)  Incarcerated hernia        Note:  This document was prepared using Dragon voice recognition software and may include unintentional dictation errors.   Jene Every, MD 06/08/19 [phone]

## 2019-06-08 NOTE — ED Notes (Signed)
Pt trx to CT.  

## 2019-06-08 NOTE — ED Notes (Signed)
ED Provider at bedside. 

## 2019-06-08 NOTE — Progress Notes (Signed)
UA to be collected the patient has not voided. Night shift RN has been notified to collect sample.

## 2019-06-09 DIAGNOSIS — K460 Unspecified abdominal hernia with obstruction, without gangrene: Secondary | ICD-10-CM

## 2019-06-09 DIAGNOSIS — N8110 Cystocele, unspecified: Secondary | ICD-10-CM | POA: Diagnosis not present

## 2019-06-09 DIAGNOSIS — E872 Acidosis: Secondary | ICD-10-CM | POA: Diagnosis not present

## 2019-06-09 DIAGNOSIS — K429 Umbilical hernia without obstruction or gangrene: Secondary | ICD-10-CM | POA: Diagnosis not present

## 2019-06-09 DIAGNOSIS — Z20822 Contact with and (suspected) exposure to covid-19: Secondary | ICD-10-CM | POA: Diagnosis not present

## 2019-06-09 DIAGNOSIS — K4090 Unilateral inguinal hernia, without obstruction or gangrene, not specified as recurrent: Secondary | ICD-10-CM | POA: Diagnosis not present

## 2019-06-09 DIAGNOSIS — K4030 Unilateral inguinal hernia, with obstruction, without gangrene, not specified as recurrent: Secondary | ICD-10-CM | POA: Diagnosis not present

## 2019-06-09 DIAGNOSIS — K449 Diaphragmatic hernia without obstruction or gangrene: Secondary | ICD-10-CM | POA: Diagnosis not present

## 2019-06-09 DIAGNOSIS — K56609 Unspecified intestinal obstruction, unspecified as to partial versus complete obstruction: Secondary | ICD-10-CM | POA: Diagnosis not present

## 2019-06-09 DIAGNOSIS — I700 Atherosclerosis of aorta: Secondary | ICD-10-CM | POA: Diagnosis not present

## 2019-06-09 LAB — CBC
HCT: 40.3 % (ref 36.0–46.0)
Hemoglobin: 13.8 g/dL (ref 12.0–15.0)
MCH: 29.9 pg (ref 26.0–34.0)
MCHC: 34.2 g/dL (ref 30.0–36.0)
MCV: 87.2 fL (ref 80.0–100.0)
Platelets: 255 10*3/uL (ref 150–400)
RBC: 4.62 MIL/uL (ref 3.87–5.11)
RDW: 13.4 % (ref 11.5–15.5)
WBC: 7.5 10*3/uL (ref 4.0–10.5)
nRBC: 0 % (ref 0.0–0.2)

## 2019-06-09 LAB — HIV ANTIBODY (ROUTINE TESTING W REFLEX): HIV Screen 4th Generation wRfx: NONREACTIVE

## 2019-06-09 LAB — URINALYSIS, COMPLETE (UACMP) WITH MICROSCOPIC
Bilirubin Urine: NEGATIVE
Glucose, UA: NEGATIVE mg/dL
Hgb urine dipstick: NEGATIVE
Ketones, ur: NEGATIVE mg/dL
Nitrite: NEGATIVE
Protein, ur: NEGATIVE mg/dL
Specific Gravity, Urine: 1.015 (ref 1.005–1.030)
pH: 5.5 (ref 5.0–8.0)

## 2019-06-09 LAB — PHOSPHORUS: Phosphorus: 3 mg/dL (ref 2.5–4.6)

## 2019-06-09 LAB — COMPREHENSIVE METABOLIC PANEL
ALT: 19 U/L (ref 0–44)
AST: 19 U/L (ref 15–41)
Albumin: 3.5 g/dL (ref 3.5–5.0)
Alkaline Phosphatase: 77 U/L (ref 38–126)
Anion gap: 5 (ref 5–15)
BUN: 17 mg/dL (ref 8–23)
CO2: 28 mmol/L (ref 22–32)
Calcium: 8.8 mg/dL — ABNORMAL LOW (ref 8.9–10.3)
Chloride: 108 mmol/L (ref 98–111)
Creatinine, Ser: 0.79 mg/dL (ref 0.44–1.00)
GFR calc Af Amer: >60 mL/min (ref >60)
GFR calc non Af Amer: >60 mL/min (ref >60)
Glucose, Bld: 114 mg/dL — ABNORMAL HIGH (ref 70–99)
Potassium: 4 mmol/L (ref 3.5–5.1)
Sodium: 141 mmol/L (ref 135–145)
Total Bilirubin: 1 mg/dL (ref 0.3–1.2)
Total Protein: 6.3 g/dL — ABNORMAL LOW (ref 6.5–8.1)

## 2019-06-09 LAB — HEMOGLOBIN A1C
Hgb A1c MFr Bld: 5.4 % (ref 4.8–5.6)
Mean Plasma Glucose: 108.28 mg/dL

## 2019-06-09 LAB — MAGNESIUM: Magnesium: 1.9 mg/dL (ref 1.7–2.4)

## 2019-06-09 LAB — LACTIC ACID, PLASMA: Lactic Acid, Venous: 1 mmol/L (ref 0.5–1.9)

## 2019-06-09 MED ORDER — AMLODIPINE BESYLATE 5 MG PO TABS: 5.0000 mg | ORAL_TABLET | Freq: Every day | ORAL | 0 refills | Status: DC

## 2019-06-09 NOTE — H&P (Addendum)
Kathryn Cameron is an 64 y.o. female.   Chief Complaint: Abdominal pain HPI: She presented to the emergency department tonight with diffuse abdominal pain.  She states that she has not been able to tolerate oral intake for the past couple of days.  She has had nausea, abdominal distention, and diffuse cramping abdominal pain.  She states that she has not had any flatus.  Does not recall her last bowel movement.  She has not had any fevers or chills.  She is known to have at least 1 hernia, but apparently she does not see doctors frequently.  She underwent a CT scan of the abdomen and pelvis that revealed bilateral inguinal hernias as well as an umbilical hernia.  There was concern for incarceration in the right inguinal hernia.  In the emergency department, the ED provider, Dr. Jene Every, was able to reduce both hernias.  She had a mild lactic acidosis of 2.1.  For this reason, she was admitted to general surgery for observation and monitoring of her lactate levels.  Past Medical History:  Diagnosis Date  . Chicken pox   . Hypertension     Past Surgical History:  Procedure Laterality Date  . LAPAROSCOPIC HYSTERECTOMY     left one ovary in place    Family History  Problem Relation Age of Onset  . Stroke Father        in his 10's  . Hypertension Other        parent  . Diabetes Other        parent   Social History:  reports that she has never smoked. She does not have any smokeless tobacco history on file. She reports that she does not drink alcohol or use drugs.  Allergies:  Allergies  Allergen Reactions  . Naproxen     No medications prior to admission.    Results for orders placed or performed during the hospital encounter of 06/08/19 (from the past 48 hour(s))  CBC     Status: Abnormal   Collection Time: 06/08/19 10:55 AM  Result Value Ref Range   WBC 8.8 4.0 - 10.5 K/uL   RBC 5.58 (H) 3.87 - 5.11 MIL/uL   Hemoglobin 16.5 (H) 12.0 - 15.0 g/dL   HCT 29.5 (H) 62.1 - 30.8  %   MCV 88.4 80.0 - 100.0 fL   MCH 29.6 26.0 - 34.0 pg   MCHC 33.5 30.0 - 36.0 g/dL   RDW 65.7 84.6 - 96.2 %   Platelets 294 150 - 400 K/uL   nRBC 0.0 0.0 - 0.2 %    Comment: Performed at [facility], [address] [place], [place] [address]  Comprehensive metabolic panel     Status: Abnormal   Collection Time: 06/08/19 10:55 AM  Result Value Ref Range   Sodium 137 135 - 145 mmol/L   Potassium 3.4 (L) 3.5 - 5.1 mmol/L   Chloride 99 98 - 111 mmol/L   CO2 26 22 - 32 mmol/L   Glucose, Bld 197 (H) 70 - 99 mg/dL    Comment: Glucose reference range applies only to samples taken after fasting for at least 8 hours.   BUN 21 8 - 23 mg/dL   Creatinine, Ser 1.32 0.44 - 1.00 mg/dL   Calcium 9.5 8.9 - 44.0 mg/dL   Total Protein 8.0 6.5 - 8.1 g/dL   Albumin 4.5 3.5 - 5.0 g/dL   AST 25 15 - 41 U/L   ALT 25 0 - 44 U/L  Alkaline Phosphatase 115 38 - 126 U/L   Total Bilirubin 1.0 0.3 - 1.2 mg/dL   GFR calc non Af Amer >60 >60 mL/min   GFR calc Af Amer >60 >60 mL/min   Anion gap 12 5 - 15    Comment: Performed at [facility], [address] [place], [place] [address]  Lipase, blood     Status: None   Collection Time: 06/08/19 10:55 AM  Result Value Ref Range   Lipase 19 11 - 51 U/L    Comment: Performed at [facility], [address] [place], [place] [address]  Lactic acid, plasma     Status: Abnormal   Collection Time: 06/08/19  2:04 PM  Result Value Ref Range   Lactic Acid, Venous 2.1 (HH) 0.5 - 1.9 mmol/L    Comment: CRITICAL RESULT CALLED TO, READ BACK BY AND VERIFIED WITH [facility] BARKER AT 9528 06/08/19.PMF Performed at [facility], [address] [place], [place] [address]   Respiratory Panel by RT PCR (Flu A&B, Covid) - Nasopharyngeal Swab     Status: None   Collection Time: 06/08/19  2:58 PM   Specimen: Nasopharyngeal Swab  Result Value Ref Range   SARS Coronavirus 2 by RT PCR NEGATIVE NEGATIVE    Comment: (NOTE) SARS-CoV-2 target  nucleic acids are NOT DETECTED. The SARS-CoV-2 RNA is generally detectable in upper respiratoy specimens during the acute phase of infection. The lowest concentration of SARS-CoV-2 viral copies this assay can detect is 131 copies/mL. A negative result does not preclude SARS-Cov-2 infection and should not be used as the sole basis for treatment or other patient management decisions. A negative result may occur with  improper specimen collection/handling, submission of specimen other than nasopharyngeal swab, presence of viral mutation(s) within the areas targeted by this assay, and inadequate number of viral copies (<131 copies/mL). A negative result must be combined with clinical observations, patient history, and epidemiological information. The expected result is Negative. Fact Sheet for Patients:  PinkCheek.be Fact Sheet for Healthcare Providers:  GravelBags.it This test is not yet ap proved or cleared by the [place] FDA and  has been authorized for detection and/or diagnosis of SARS-CoV-2 by FDA under an Emergency Use Authorization (EUA). This EUA will remain  in effect (meaning this test can be used) for the duration of the COVID-19 declaration under Section 564(b)(1) of the Act, 21 U.S.C. section 360bbb-3(b)(1), unless the authorization is terminated or revoked sooner.    Influenza A by PCR NEGATIVE NEGATIVE   Influenza B by PCR NEGATIVE NEGATIVE    Comment: (NOTE) The Xpert Xpress SARS-CoV-2/FLU/RSV assay is intended as an aid in  the diagnosis of influenza from Nasopharyngeal swab specimens and  should not be used as a sole basis for treatment. Nasal washings and  aspirates are unacceptable for Xpert Xpress SARS-CoV-2/FLU/RSV  testing. Fact Sheet for Patients: PinkCheek.be Fact Sheet for Healthcare Providers: GravelBags.it This test is not yet approved  or cleared by the [place] FDA and  has been authorized for detection and/or diagnosis of SARS-CoV-2 by  FDA under an Emergency Use Authorization (EUA). This EUA will remain  in effect (meaning this test can be used) for the duration of the  Covid-19 declaration under Section 564(b)(1) of the Act, 21  U.S.C. section 360bbb-3(b)(1), unless the authorization is  terminated or revoked. Performed at [facility], [place], [place] [address]   Lactic acid, plasma     Status: None   Collection  Time: 06/08/19  5:51 PM  Result Value Ref Range   Lactic Acid, Venous 1.7 0.5 - 1.9 mmol/L    Comment: Performed at [facility], [address] [place], [place] [address]  HIV Antibody (routine testing w rflx)     Status: None   Collection Time: 06/08/19  5:51 PM  Result Value Ref Range   HIV Screen 4th Generation wRfx Non Reactive Non Reactive    Comment: Performed at [facility], [address], [place], [place] [address]   CT ABDOMEN PELVIS W CONTRAST  Result Date: 06/08/2019 CLINICAL DATA:  Abdominal distension, abdominal and pelvic pain since this morning, increased cramping in LEFT leg, history hypertension EXAM: CT ABDOMEN AND PELVIS WITH CONTRAST TECHNIQUE: Multidetector CT imaging of the abdomen and pelvis was performed using the standard protocol following bolus administration of intravenous contrast. Sagittal and coronal MPR images reconstructed from axial data set. CONTRAST:  OMNIPAQUE IOHEXOL 300 MG/ML SOLN IV. No oral contrast. COMPARISON:  10/22/2012 FINDINGS: Lower chest: Minimal dependent atelectasis at lung bases Hepatobiliary: Tiny cyst lateral liver 4 mm diameter image 19. Gallbladder and liver otherwise normal appearance Pancreas: Normal appearance Spleen: Normal appearance Adrenals/Urinary Tract: Adrenal glands, kidneys, and ureters normal appearance. Small cystocele; bladder otherwise unremarkable. Stomach/Bowel: Nonobstructed segment  of sigmoid colon extends into a moderate-sized LEFT inguinal hernia containing fat. Mobile cecum located at midline in upper to mid pelvis. Appendix not identified. Moderate-sized hiatal hernia containing fluid. Small-bowel obstruction secondary to multiple loops of distal ileum within a large RIGHT inguinal hernia. Associated wall thickening of involved small bowel loops mesenteric edema and free fluid suggesting incarceration. Associated small bowel stool sign. Mucosal enhancement is seen within the small bowel as well as within mesenteric vessels within the hernia. Dilatation of small bowel loops proximal to the hernia with associated mesenteric edema. No evidence of perforation. Vascular/Lymphatic: Vascular structures patent. Minimal atherosclerotic calcification aorta without aneurysm. No adenopathy. Few scattered normal size mesenteric lymph nodes seen. Reproductive: Uterus surgically absent. Nonvisualization of LEFT ovary with normal appearing RIGHT ovary. Other: Free fluid perihepatic and perisplenic as well as within RIGHT inguinal hernia sac. No free air. Umbilical hernia containing fat. BILATERAL inguinal hernias as discussed above. Musculoskeletal: Joint space narrowing of the hip joints bilaterally. No acute osseous findings. IMPRESSION: Small-bowel obstruction due to multiple loops of distal ileum in a large RIGHT inguinal hernia. Associated bowel wall thickening of involved small bowel loops, mesenteric edema and free fluid suggesting incarceration. No evidence of perforation. Small bowel loops proximal to the hernia are dilated with minimal wall thickening and extensive mesenteric edema consistent with obstruction. No definite evidence of bowel ischemia identified though not excluded. Moderate-sized hiatal hernia. Small umbilical hernia containing fat. Moderate-sized LEFT inguinal hernia containing nonobstructed sigmoid colon segment. Small cystocele. Aortic Atherosclerosis (ICD10-I70.0). Findings  called to Dr. Cyril Loosen on 06/08/2019 at 1350 hours. Electronically Signed   By: Ulyses Southward M.D.   On: 06/08/2019 13:52    Review of Systems  Gastrointestinal: Positive for abdominal distention, abdominal pain and nausea.  Musculoskeletal: Positive for arthralgias and back pain.  All other systems reviewed and are negative.   Blood pressure (!) 147/73, pulse 74, temperature 98.4 F (36.9 C), temperature source Oral, resp. rate 20, height 5\' 3"  (1.6 m), weight 74.8 kg, SpO2 99 %. Physical Exam  Constitutional: She is oriented to person, place, and time. She appears well-developed and well-nourished. No distress.  HENT:  Head: Normocephalic and atraumatic.  Mouth/Throat: Oropharynx is clear  and moist.  Eyes: Right eye exhibits no discharge. Left eye exhibits no discharge.  Neck: No tracheal deviation present. No thyromegaly present.  Cardiovascular: Normal rate and regular rhythm.  Respiratory: Effort normal. No stridor. No respiratory distress.  GI: Soft.  Bilateral inguinal hernias.  The right is fairly massive and the involved bowel extends down into her labia.  Umbilical hernia is also present.  All are soft and nontender.  Genitourinary:    Genitourinary Comments: Deferred   Musculoskeletal:        General: No deformity or edema.  Neurological: She is alert and oriented to person, place, and time.  Skin: Skin is warm and dry.     Assessment/Plan We will admit her to the floor tonight for observation.  We will recheck a lactic acid.  She will be given IV fluids.  As there is no current need for urgent or emergent surgical intervention, she will be given a clear liquid diet however if she develops nausea and vomiting, we may need to make her n.p.o. in place a nasogastric tube.  Duanne Guess, MD 06/09/2019, 1:26 AM

## 2019-06-09 NOTE — Discharge Summary (Addendum)
[facility] SURGICAL DISCHARGE SUMMARY (cpt: [phone])  Patient ID: Kathryn Cameron MRN: [id number] DOB/AGE: 1955/05/03 64 y.o.  Admit date: 06/08/2019 Discharge date: 06/09/2019  Discharge Diagnoses Patient Active Problem List   Diagnosis Date Noted   Hernia of abdominal cavity 06/08/2019   Essential hypertension 11/18/2014   Headache 11/18/2014   Trouble swallowing 11/18/2014   Mass of right inguinal region 11/18/2014    Consultants None  Procedures None  HPI: She presented to the emergency department tonight with diffuse abdominal pain.  She states that she has not been able to tolerate oral intake for the past couple of days.  She has had nausea, abdominal distention, and diffuse cramping abdominal pain.  She states that she has not had any flatus.  Does not recall her last bowel movement.  She has not had any fevers or chills.  She is known to have at least 1 hernia, but apparently she does not see doctors frequently.  She underwent a CT scan of the abdomen and pelvis that revealed bilateral inguinal hernias as well as an umbilical hernia.  There was concern for incarceration in the right inguinal hernia.  In the emergency department, the ED provider, Dr. Jene Every, was able to reduce both hernias.  She had a mild lactic acidosis of 2.1.  For this reason, she was admitted to general surgery for observation and monitoring of her lactate levels.  Hospital Course: She was admitted to general surgery for observation and trending labs. On the morning of discharge (05/03), her hernias remained reduced and lactic acidosis resolved. The remainder of patient's hospital course was essentially unremarkable, and discharge planning was initiated accordingly with patient safely able to be discharged home with appropriate discharge instructions, pain control, and outpatient follow-up after all of her questions were answered to her expressed satisfaction.   Discharge Condition:  Good   Physical Examination:  Constitutional: Well appearing female, NAD Pulmonary: Normal effort, no respiratory distress Cardiac: HRRR Gastrointestinal: Soft, non-tender, non-distended, bilateral inguinal hernias remain reduced, umbilical hernia Skin: Warm, Dry   Allergies as of 06/09/2019       Reactions   Naproxen         Medication List     TAKE these medications    amLODipine 5 MG tablet Commonly known as: NORVASC Take 1 tablet (5 mg total) by mouth daily.         Follow-up Information     [facility]. Go on 06/19/2019.   Specialty: General Surgery Why: 2:15pm appointment Contact information: [address] [place] [address] [phone]            Time spent on discharge management including discussion of hospital course, clinical condition, outpatient instructions, prescriptions, and follow up with the patient and members of the medical team: >30 minutes  -- Lynden Oxford , PA-C [facility]  06/09/2019, 10:23 AM [phone] M-F: 7am - 4pm  I saw and evaluated the patient.  I agree with the above documentation, exam, and plan, which I have edited where appropriate. Duanne Guess  2:05 PM

## 2019-06-09 NOTE — Plan of Care (Signed)
The patient has been discharged. IV removed. Education provided.  Problem: Education: Goal: Knowledge of General Education information will improve Description: Including pain rating scale, medication(s)/side effects and non-pharmacologic comfort measures Outcome: Completed/Met   Problem: Health Behavior/Discharge Planning: Goal: Ability to manage health-related needs will improve Outcome: Completed/Met   Problem: Clinical Measurements: Goal: Ability to maintain clinical measurements within normal limits will improve Outcome: Completed/Met Goal: Will remain free from infection Outcome: Completed/Met Goal: Diagnostic test results will improve Outcome: Completed/Met Goal: Respiratory complications will improve Outcome: Completed/Met Goal: Cardiovascular complication will be avoided Outcome: Completed/Met   Problem: Activity: Goal: Risk for activity intolerance will decrease Outcome: Completed/Met   Problem: Nutrition: Goal: Adequate nutrition will be maintained Outcome: Completed/Met   Problem: Coping: Goal: Level of anxiety will decrease Outcome: Completed/Met   Problem: Elimination: Goal: Will not experience complications related to bowel motility Outcome: Completed/Met Goal: Will not experience complications related to urinary retention Outcome: Completed/Met   Problem: Pain Managment: Goal: General experience of comfort will improve Outcome: Completed/Met   Problem: Safety: Goal: Ability to remain free from injury will improve Outcome: Completed/Met

## 2019-06-09 NOTE — Clinical Social Work Note (Signed)
Per RN, patient has no PCP. CSW provided list of local options with addresses and phone numbers. Patient has orders to discharge home today. Her daughter will pick her up. No further concerns.  CSW signing off.  Charlynn Court, CSW [phone]

## 2019-06-19 ENCOUNTER — Ambulatory Visit (INDEPENDENT_AMBULATORY_CARE_PROVIDER_SITE_OTHER): Payer: 59 | Admitting: Surgery

## 2019-06-19 ENCOUNTER — Ambulatory Visit: Payer: Self-pay | Admitting: Surgery

## 2019-06-19 ENCOUNTER — Telehealth: Payer: Self-pay | Admitting: Surgery

## 2019-06-19 ENCOUNTER — Encounter: Payer: Self-pay | Admitting: Surgery

## 2019-06-19 ENCOUNTER — Encounter: Payer: Self-pay | Admitting: Emergency Medicine

## 2019-06-19 ENCOUNTER — Other Ambulatory Visit
Admission: RE | Admit: 2019-06-19 | Discharge: 2019-06-19 | Disposition: A | Discharge status: Home / Self Care | Payer: 59 | Source: Ambulatory Visit | Attending: Surgery | Admitting: Surgery

## 2019-06-19 ENCOUNTER — Other Ambulatory Visit: Payer: Self-pay

## 2019-06-19 VITALS — BP 191/108 | HR 85 | Temp 97.9°F | Resp 14 | Ht 63.0 in | Wt 167.6 lb

## 2019-06-19 DIAGNOSIS — K4000 Bilateral inguinal hernia, with obstruction, without gangrene, not specified as recurrent: Secondary | ICD-10-CM | POA: Diagnosis not present

## 2019-06-19 DIAGNOSIS — Z01818 Encounter for other preprocedural examination: Secondary | ICD-10-CM | POA: Diagnosis not present

## 2019-06-19 DIAGNOSIS — I10 Essential (primary) hypertension: Secondary | ICD-10-CM | POA: Diagnosis not present

## 2019-06-19 DIAGNOSIS — Z20822 Contact with and (suspected) exposure to covid-19: Secondary | ICD-10-CM | POA: Diagnosis not present

## 2019-06-19 DIAGNOSIS — Z886 Allergy status to analgesic agent status: Secondary | ICD-10-CM | POA: Diagnosis not present

## 2019-06-19 DIAGNOSIS — Z8249 Family history of ischemic heart disease and other diseases of the circulatory system: Secondary | ICD-10-CM | POA: Diagnosis not present

## 2019-06-19 LAB — CBC WITH DIFFERENTIAL/PLATELET
Abs Immature Granulocytes: 0.01 10*3/uL (ref 0.00–0.07)
Basophils Absolute: 0 10*3/uL (ref 0.0–0.1)
Basophils Relative: 0 %
Eosinophils Absolute: 0.2 10*3/uL (ref 0.0–0.5)
Eosinophils Relative: 4 %
HCT: 41.8 % (ref 36.0–46.0)
Hemoglobin: 13.9 g/dL (ref 12.0–15.0)
Immature Granulocytes: 0 %
Lymphocytes Relative: 34 %
Lymphs Abs: 1.7 10*3/uL (ref 0.7–4.0)
MCH: 29.4 pg (ref 26.0–34.0)
MCHC: 33.3 g/dL (ref 30.0–36.0)
MCV: 88.4 fL (ref 80.0–100.0)
Monocytes Absolute: 0.5 10*3/uL (ref 0.1–1.0)
Monocytes Relative: 10 %
Neutro Abs: 2.6 10*3/uL (ref 1.7–7.7)
Neutrophils Relative %: 52 %
Platelets: 331 10*3/uL (ref 150–400)
RBC: 4.73 MIL/uL (ref 3.87–5.11)
RDW: 13 % (ref 11.5–15.5)
WBC: 5.1 10*3/uL (ref 4.0–10.5)
nRBC: 0 % (ref 0.0–0.2)

## 2019-06-19 LAB — HEMOGLOBIN A1C
Hgb A1c MFr Bld: 5.5 % (ref 4.8–5.6)
Mean Plasma Glucose: 111.15 mg/dL

## 2019-06-19 LAB — COMPREHENSIVE METABOLIC PANEL
ALT: 25 U/L (ref 0–44)
AST: 18 U/L (ref 15–41)
Albumin: 4.1 g/dL (ref 3.5–5.0)
Alkaline Phosphatase: 98 U/L (ref 38–126)
Anion gap: 8 (ref 5–15)
BUN: 22 mg/dL (ref 8–23)
CO2: 27 mmol/L (ref 22–32)
Calcium: 9.6 mg/dL (ref 8.9–10.3)
Chloride: 105 mmol/L (ref 98–111)
Creatinine, Ser: 0.64 mg/dL (ref 0.44–1.00)
GFR calc Af Amer: >60 mL/min (ref >60)
GFR calc non Af Amer: >60 mL/min (ref >60)
Glucose, Bld: 100 mg/dL — ABNORMAL HIGH (ref 70–99)
Potassium: 3.9 mmol/L (ref 3.5–5.1)
Sodium: 140 mmol/L (ref 135–145)
Total Bilirubin: 0.7 mg/dL (ref 0.3–1.2)
Total Protein: 7.2 g/dL (ref 6.5–8.1)

## 2019-06-19 LAB — SARS CORONAVIRUS 2 (TAT 6-24 HRS): SARS Coronavirus 2: NEGATIVE

## 2019-06-19 NOTE — Progress Notes (Signed)
Patient ID: Kathryn Cameron, female   DOB: 07/17/1955, 64 y.o.   MRN: [id number]  Chief Complaint: ER follow-up for inguinal hernia  History of Present Illness Kathryn Cameron is a 64 y.o. female with a long history of bilateral inguinal hernias.  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  She reports she has significant amount of pain with progressive abdominal distention and epigastric pain cramping with associated nausea.  She currently denies any fevers any nausea and any vomiting.  She has had a known bulge for many years which waxed and waned with her activity.  She was seen in emergency department on May 2 and had a CT scan confirming an incarcerated right inguinal hernia with associated bowel obstruction.  ER doctor reduced sufficiently and she has had no obstructive symptoms since.  Past Medical History Past Medical History:  Diagnosis Date  . Chicken pox   . Hypertension       Past Surgical History:  Procedure Laterality Date  . LAPAROSCOPIC HYSTERECTOMY     left one ovary in place    Allergies  Allergen Reactions  . Naproxen     Current Outpatient Medications  Medication Sig Dispense Refill  . amLODipine (NORVASC) 5 MG tablet Take 1 tablet (5 mg total) by mouth daily. 30 tablet 0   No current facility-administered medications for this visit.    Family History Family History  Problem Relation Age of Onset  . Stroke Father        in his 80's  . Hypertension Other        parent  . Diabetes Other        parent      Social History Social History   Tobacco Use  . Smoking status: Never Smoker  . Smokeless tobacco: Never Used  Substance Use Topics  . Alcohol use: No    Alcohol/week: 0.0 standard drinks  . Drug use: No        Review of Systems  Constitutional: Negative for chills and fever.  HENT: Negative for hearing loss.   Eyes: Negative.   Respiratory: Negative for cough, shortness of breath and wheezing.   Cardiovascular:  Negative for chest pain.  Gastrointestinal: Negative for diarrhea and vomiting.  Genitourinary: Negative for dysuria.  Musculoskeletal: Negative for back pain and joint pain.  Skin: Negative.   Neurological: Negative.   Endo/Heme/Allergies: Does not bruise/bleed easily.      Physical Exam Blood pressure (!) 191/108, pulse 85, temperature 97.9 F (36.6 C), resp. rate 14, height 5' 3" (1.6 m), weight 167 lb 9.6 oz (76 kg), SpO2 97 %. Last Weight  Most recent update: 06/19/2019 10:27 AM   Weight  76 kg (167 lb 9.6 oz)            CONSTITUTIONAL: Well developed, and nourished, appropriately responsive and aware without distress.  Appears a bit older than her stated age. EYES: Sclera non-icteric.   EARS, NOSE, MOUTH AND THROAT: Mask worn.   Hearing is intact to voice.  NECK: Trachea is midline, and there is no jugular venous distension.  LYMPH NODES:  Lymph nodes in the neck are not enlarged. RESPIRATORY:  Lungs are clear, and breath sounds are equal bilaterally. Normal respiratory effort without pathologic use of accessory muscles. CARDIOVASCULAR: Heart is regular in rate and rhythm. GI: The abdomen is soft, nontender, and nondistended. There were no palpable masses. I did not appreciate hepatosplenomegaly. There were normal bowel sounds.  Large   right lower quadrant/groin masses, right greater than left.  Both are soft and slightly reducible but tender when attempting complete reduction. MUSCULOSKELETAL:  Symmetrical muscle tone appreciated in all four extremities.    SKIN: Skin turgor is normal. No pathologic skin lesions appreciated.  NEUROLOGIC:  Motor and sensation appear grossly normal.  Cranial nerves are grossly without defect. PSYCH:  Alert and oriented to person, place and time. Affect is appropriate for situation.  Data Reviewed I have personally reviewed what is currently available of the patient's imaging, recent labs and medical records.   Labs:  CBC Latest Ref Rng & Units  06/09/2019 06/08/2019  WBC 4.0 - 10.5 K/uL 7.5 8.8  Hemoglobin 12.0 - 15.0 g/dL 13.8 16.5(H)  Hematocrit 36.0 - 46.0 % 40.3 49.3(H)  Platelets 150 - 400 K/uL 255 294   CMP Latest Ref Rng & Units 06/09/2019 06/08/2019 11/18/2014  Glucose 70 - 99 mg/dL 114(H) 197(H) 89  BUN 8 - 23 mg/dL 17 21 21   Creatinine 0.44 - 1.00 mg/dL 0.79 0.68 0.68  Sodium 135 - 145 mmol/L 141 137 141  Potassium 3.5 - 5.1 mmol/L 4.0 3.4(L) 4.2  Chloride 98 - 111 mmol/L 108 99 105  CO2 22 - 32 mmol/L 28 26 28   Calcium 8.9 - 10.3 mg/dL 8.8(L) 9.5 9.8  Total Protein 6.5 - 8.1 g/dL 6.3(L) 8.0 -  Total Bilirubin 0.3 - 1.2 mg/dL 1.0 1.0 -  Alkaline Phos 38 - 126 U/L 77 115 -  AST 15 - 41 U/L 19 25 -  ALT 0 - 44 U/L 19 25 -      Imaging: Radiology review:  CLINICAL DATA:  Abdominal distension, abdominal and pelvic pain since this morning, increased cramping in LEFT leg, history hypertension  EXAM: CT ABDOMEN AND PELVIS WITH CONTRAST  TECHNIQUE: Multidetector CT imaging of the abdomen and pelvis was performed using the standard protocol following bolus administration of intravenous contrast. Sagittal and coronal MPR images reconstructed from axial data set.  CONTRAST:  160mL OMNIPAQUE IOHEXOL 300 MG/ML SOLN IV. No oral contrast.  COMPARISON:  10/22/2012  FINDINGS: Lower chest: Minimal dependent atelectasis at lung bases  Hepatobiliary: Tiny cyst lateral liver 4 mm diameter image 19. Gallbladder and liver otherwise normal appearance  Pancreas: Normal appearance  Spleen: Normal appearance  Adrenals/Urinary Tract: Adrenal glands, kidneys, and ureters normal appearance. Small cystocele; bladder otherwise unremarkable.  Stomach/Bowel: Nonobstructed segment of sigmoid colon extends into a moderate-sized LEFT inguinal hernia containing fat. Mobile cecum located at midline in upper to mid pelvis. Appendix not identified. Moderate-sized hiatal hernia containing fluid. Small-bowel obstruction  secondary to multiple loops of distal ileum within a large RIGHT inguinal hernia. Associated wall thickening of involved small bowel loops mesenteric edema and free fluid suggesting incarceration. Associated small bowel stool sign. Mucosal enhancement is seen within the small bowel as well as within mesenteric vessels within the hernia. Dilatation of small bowel loops proximal to the hernia with associated mesenteric edema. No evidence of perforation.  Vascular/Lymphatic: Vascular structures patent. Minimal atherosclerotic calcification aorta without aneurysm. No adenopathy. Few scattered normal size mesenteric lymph nodes seen.  Reproductive: Uterus surgically absent. Nonvisualization of LEFT ovary with normal appearing RIGHT ovary.  Other: Free fluid perihepatic and perisplenic as well as within RIGHT inguinal hernia sac. No free air. Umbilical hernia containing fat. BILATERAL inguinal hernias as discussed above.  Musculoskeletal: Joint space narrowing of the hip joints bilaterally. No acute osseous findings.  IMPRESSION: Small-bowel obstruction due to multiple loops of distal ileum in a  large RIGHT inguinal hernia.  Associated bowel wall thickening of involved small bowel loops, mesenteric edema and free fluid suggesting incarceration.  No evidence of perforation.  Small bowel loops proximal to the hernia are dilated with minimal wall thickening and extensive mesenteric edema consistent with obstruction.  No definite evidence of bowel ischemia identified though not excluded.  Moderate-sized hiatal hernia.  Small umbilical hernia containing fat.  Moderate-sized LEFT inguinal hernia containing nonobstructed sigmoid colon segment.  Small cystocele.  Aortic Atherosclerosis (ICD10-I70.0).  Findings called to Dr. Cyril Loosen on 06/08/2019 at 1350 hours.   Electronically Signed   By: Ulyses Southward M.D.   On: 06/08/2019 13:52  Within last 24 hrs: No  results found.  Assessment    Bilateral incarcerated inguinal hernias. Relatively speaking rather small umbilical hernia. Patient Active Problem List   Diagnosis Date Noted  . Hernia of abdominal cavity 06/08/2019  . Essential hypertension 11/18/2014  . Headache 11/18/2014  . Trouble swallowing 11/18/2014  . Mass of right inguinal region 11/18/2014    Plan    Robotic repair of rather large bilateral incarcerated inguinal hernias. Her umbilical hernia is rather asymptomatic and may be deferred for a different procedure considering the extent of the groin hernias were dealing with. I discussed possibility of incarceration, strangulation, enlargement in size over time, and the risk of emergency surgery in the face of strangulation.  Also discussed the risk of surgery including recurrence which can be up to 30% in the case of complex hernias, use of prosthetic materials (mesh) and the increased risk of infxn, post-op infxn and the possible need for re-operation and removal of mesh if used, possibility of post-op SBO or ileus, and the risks of general anesthetic including MI, CVA, sudden death or even reaction to anesthetic medications. The patient and family understands the risks, any and all questions were answered to the patient's satisfaction.   Face-to-face time spent with the patient and accompanying care providers(if present) was 45 minutes, with more than 50% of the time spent counseling, educating, and coordinating care of the patient.      Campbell Lerner M.D., FACS 06/19/2019, 11:13 AM

## 2019-06-19 NOTE — Patient Instructions (Addendum)
Our surgery scheduler will contact you to schedule your surgery. Please have the blue sheet available when she calls you.  Call the office if you have any questions or concerns.  Inguinal Hernia, Adult An inguinal hernia develops when fat or the intestines push through a weak spot in a muscle where your leg meets your lower abdomen (groin). This creates a bulge. This kind of hernia could also be:  In your scrotum, if you are female.  In folds of skin around your vagina, if you are female. There are three types of inguinal hernias:  Hernias that can be pushed back into the abdomen (are reducible). This type rarely causes pain.  Hernias that are not reducible (are incarcerated).  Hernias that are not reducible and lose their blood supply (are strangulated). This type of hernia requires emergency surgery. What are the causes? This condition is caused by having a weak spot in the muscles or tissues in the groin. This weak spot develops over time. The hernia may poke through the weak spot when you suddenly strain your lower abdominal muscles, such as when you:  Lift a heavy object.  Strain to have a bowel movement. Constipation can lead to straining.  Cough. What increases the risk? This condition is more likely to develop in:  Men.  Pregnant women.  People who: ? Are overweight. ? Work in jobs that require long periods of standing or heavy lifting. ? Have had an inguinal hernia before. ? Smoke or have lung disease. These factors can lead to long-lasting (chronic) coughing. What are the signs or symptoms? Symptoms may depend on the size of the hernia. Often, a small inguinal hernia has no symptoms. Symptoms of a larger hernia may include:  A lump in the groin area. This is easier to see when standing. It might not be visible when lying down.  Pain or burning in the groin. This may get worse when lifting, straining, or coughing.  A dull ache or a feeling of pressure in the  groin.  In men, an unusual lump in the scrotum. Symptoms of a strangulated inguinal hernia may include:  A bulge in your groin that is very painful and tender to the touch.  A bulge that turns red or purple.  Fever, nausea, and vomiting.  Inability to have a bowel movement or to pass gas. How is this diagnosed? This condition is diagnosed based on your symptoms, your medical history, and a physical exam. Your health care provider may feel your groin area and ask you to cough. How is this treated? Treatment depends on the size of your hernia and whether you have symptoms. If you do not have symptoms, your health care provider may have you watch your hernia carefully and have you come in for follow-up visits. If your hernia is large or if you have symptoms, you may need surgery to repair the hernia. Follow these instructions at home: Lifestyle  Avoid lifting heavy objects.  Avoid standing for long periods of time.  Do not use any products that contain nicotine or tobacco, such as cigarettes and e-cigarettes. If you need help quitting, ask your health care provider.  Maintain a healthy weight. Preventing constipation  Take actions to prevent constipation. Constipation leads to straining with bowel movements, which can make a hernia worse or cause a hernia repair to break down. Your health care provider may recommend that you: ? Drink enough fluid to keep your urine pale yellow. ? Eat foods that are high in  fiber, such as fresh fruits and vegetables, whole grains, and beans. ? Limit foods that are high in fat and processed sugars, such as fried or sweet foods. ? Take an over-the-counter or prescription medicine for constipation. General instructions  You may try to push the hernia back in place by very gently pressing on it while lying down. Do not try to force the bulge back in if it will not push in easily.  Watch your hernia for any changes in shape, size, or color. Get help right  away if you notice any changes.  Take over-the-counter and prescription medicines only as told by your health care provider.  Keep all follow-up visits as told by your health care provider. This is important. Contact a health care provider if:  You have a fever.  You develop new symptoms.  Your symptoms get worse. Get help right away if:  You have pain in your groin that suddenly gets worse.  You have a bulge in your groin that: ? Suddenly gets bigger and does not get smaller. ? Becomes red or purple or painful to the touch.  You are a man and you have a sudden pain in your scrotum, or the size of your scrotum suddenly changes.  You cannot push the hernia back in place by very gently pressing on it when you are lying down. Do not try to force the bulge back in if it will not push in easily.  You have nausea or vomiting that does not go away.  You have a fast heartbeat.  You cannot have a bowel movement or pass gas. These symptoms may represent a serious problem that is an emergency. Do not wait to see if the symptoms will go away. Get medical help right away. Call your local emergency services (911 in the [place] Summary  An inguinal hernia develops when fat or the intestines push through a weak spot in a muscle where your leg meets your lower abdomen (groin).  This condition is caused by having a weak spot in muscles or tissue in your groin.  Symptoms may depend on the size of the hernia, and they may include pain or swelling in your groin. A small inguinal hernia often has no symptoms.  Treatment may not be needed if you do not have symptoms. If you have symptoms or a large hernia, you may need surgery to repair the hernia.  Avoid lifting heavy objects. Also avoid standing for long amounts of time. This information is not intended to replace advice given to you by your health care provider. Make sure you discuss any questions you have with your health care  provider. Document Revised: 02/24/2017 Document Reviewed: 10/25/2016 Elsevier Patient Education  2020 [employer]  Umbilical Hernia, Adult  A hernia is a bulge of tissue that pushes through an opening between muscles. An umbilical hernia happens in the abdomen, near the belly button (umbilicus). The hernia may contain tissues from the small intestine, large intestine, or fatty tissue covering the intestines (omentum). Umbilical hernias in adults tend to get worse over time, and they require surgical treatment. There are several types of umbilical hernias. You may have:  A hernia located just above or below the umbilicus (indirect hernia). This is the most common type of umbilical hernia in adults.  A hernia that forms through an opening formed by the umbilicus (direct hernia).  A hernia that comes and goes (reducible hernia). A reducible hernia may be visible only when you strain, lift something  heavy, or cough. This type of hernia can be pushed back into the abdomen (reduced).  A hernia that traps abdominal tissue inside the hernia (incarcerated hernia). This type of hernia cannot be reduced.  A hernia that cuts off blood flow to the tissues inside the hernia (strangulated hernia). The tissues can start to die if this happens. This type of hernia requires emergency treatment. What are the causes? An umbilical hernia happens when tissue inside the abdomen presses on a weak area of the abdominal muscles. What increases the risk? You may have a greater risk of this condition if you:  Are obese.  Have had several pregnancies.  Have a buildup of fluid inside your abdomen (ascites).  Have had surgery that weakens the abdominal muscles. What are the signs or symptoms? The main symptom of this condition is a painless bulge at or near the belly button. A reducible hernia may be visible only when you strain, lift something heavy, or cough. Other symptoms may include:  Dull pain.  A  feeling of pressure. Symptoms of a strangulated hernia may include:  Pain that gets increasingly worse.  Nausea and vomiting.  Pain when pressing on the hernia.  Skin over the hernia becoming red or purple.  Constipation.  Blood in the stool. How is this diagnosed? This condition may be diagnosed based on:  A physical exam. You may be asked to cough or strain while standing. These actions increase the pressure inside your abdomen and force the hernia through the opening in your muscles. Your health care provider may try to reduce the hernia by pressing on it.  Your symptoms and medical history. How is this treated? Surgery is the only treatment for an umbilical hernia. Surgery for a strangulated hernia is done as soon as possible. If you have a small hernia that is not incarcerated, you may need to lose weight before having surgery. Follow these instructions at home:  Lose weight, if told by your health care provider.  Do not try to push the hernia back in.  Watch your hernia for any changes in color or size. Tell your health care provider if any changes occur.  You may need to avoid activities that increase pressure on your hernia.  Do not lift anything that is heavier than 10 lb (4.5 kg) until your health care provider says that this is safe.  Take over-the-counter and prescription medicines only as told by your health care provider.  Keep all follow-up visits as told by your health care provider. This is important. Contact a health care provider if:  Your hernia gets larger.  Your hernia becomes painful. Get help right away if:  You develop sudden, severe pain near the area of your hernia.  You have pain as well as nausea or vomiting.  You have pain and the skin over your hernia changes color.  You develop a fever. This information is not intended to replace advice given to you by your health care provider. Make sure you discuss any questions you have with your  health care provider. Document Revised: 03/07/2017 Document Reviewed: 07/24/2016 Elsevier Patient Education  [place].

## 2019-06-19 NOTE — Telephone Encounter (Signed)
Pt has been advised of Pre-Admission date/time, COVID Testing date and Surgery date.  Surgery Date: 06/23/19 Preadmission Testing Date: 06/23/19 (2 hr prior) Covid Testing Date: 06/19/19 - patient advised to go to [facility] ([address] [facility]) between 8a-1p  Patient has been made aware to call [phone], between 1-3:00pm the day before surgery, to find out what time to arrive for surgery.

## 2019-06-19 NOTE — H&P (View-Only) (Signed)
Patient ID: Kathryn Cameron, female   DOB: 06/08/1955, 64 y.o.   MRN: [id number]  Chief Complaint: ER follow-up for inguinal hernia  History of Present Illness Kathryn Cameron is a 64 y.o. female with a long history of bilateral inguinal hernias.  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  She reports she has significant amount of pain with progressive abdominal distention and epigastric pain cramping with associated nausea.  She currently denies any fevers any nausea and any vomiting.  She has had a known bulge for many years which waxed and waned with her activity.  She was seen in emergency department on May 2 and had a CT scan confirming an incarcerated right inguinal hernia with associated bowel obstruction.  ER doctor reduced sufficiently and she has had no obstructive symptoms since.  Past Medical History Past Medical History:  Diagnosis Date  . Chicken pox   . Hypertension       Past Surgical History:  Procedure Laterality Date  . LAPAROSCOPIC HYSTERECTOMY     left one ovary in place    Allergies  Allergen Reactions  . Naproxen     Current Outpatient Medications  Medication Sig Dispense Refill  . amLODipine (NORVASC) 5 MG tablet Take 1 tablet (5 mg total) by mouth daily. 30 tablet 0   No current facility-administered medications for this visit.    Family History Family History  Problem Relation Age of Onset  . Stroke Father        in his 78's  . Hypertension Other        parent  . Diabetes Other        parent      Social History Social History   Tobacco Use  . Smoking status: Never Smoker  . Smokeless tobacco: Never Used  Substance Use Topics  . Alcohol use: No    Alcohol/week: 0.0 standard drinks  . Drug use: No        Review of Systems  Constitutional: Negative for chills and fever.  HENT: Negative for hearing loss.   Eyes: Negative.   Respiratory: Negative for cough, shortness of breath and wheezing.   Cardiovascular:  Negative for chest pain.  Gastrointestinal: Negative for diarrhea and vomiting.  Genitourinary: Negative for dysuria.  Musculoskeletal: Negative for back pain and joint pain.  Skin: Negative.   Neurological: Negative.   Endo/Heme/Allergies: Does not bruise/bleed easily.      Physical Exam Blood pressure (!) 191/108, pulse 85, temperature 97.9 F (36.6 C), resp. rate 14, height 5\' 3"  (1.6 m), weight 167 lb 9.6 oz (76 kg), SpO2 97 %. Last Weight  Most recent update: 06/19/2019 10:27 AM   Weight  76 kg (167 lb 9.6 oz)            CONSTITUTIONAL: Well developed, and nourished, appropriately responsive and aware without distress.  Appears a bit older than her stated age. EYES: Sclera non-icteric.   EARS, NOSE, MOUTH AND THROAT: Mask worn.   Hearing is intact to voice.  NECK: Trachea is midline, and there is no jugular venous distension.  LYMPH NODES:  Lymph nodes in the neck are not enlarged. RESPIRATORY:  Lungs are clear, and breath sounds are equal bilaterally. Normal respiratory effort without pathologic use of accessory muscles. CARDIOVASCULAR: Heart is regular in rate and rhythm. GI: The abdomen is soft, nontender, and nondistended. There were no palpable masses. I did not appreciate hepatosplenomegaly. There were normal bowel sounds.  Large  right lower quadrant/groin masses, right greater than left.  Both are soft and slightly reducible but tender when attempting complete reduction. MUSCULOSKELETAL:  Symmetrical muscle tone appreciated in all four extremities.    SKIN: Skin turgor is normal. No pathologic skin lesions appreciated.  NEUROLOGIC:  Motor and sensation appear grossly normal.  Cranial nerves are grossly without defect. PSYCH:  Alert and oriented to person, place and time. Affect is appropriate for situation.  Data Reviewed I have personally reviewed what is currently available of the patient's imaging, recent labs and medical records.   Labs:  CBC Latest Ref Rng & Units  06/09/2019 06/08/2019  WBC 4.0 - 10.5 K/uL 7.5 8.8  Hemoglobin 12.0 - 15.0 g/dL 13.8 16.5(H)  Hematocrit 36.0 - 46.0 % 40.3 49.3(H)  Platelets 150 - 400 K/uL 255 294   CMP Latest Ref Rng & Units 06/09/2019 06/08/2019 11/18/2014  Glucose 70 - 99 mg/dL 114(H) 197(H) 89  BUN 8 - 23 mg/dL 17 21 21   Creatinine 0.44 - 1.00 mg/dL 0.79 0.68 0.68  Sodium 135 - 145 mmol/L 141 137 141  Potassium 3.5 - 5.1 mmol/L 4.0 3.4(L) 4.2  Chloride 98 - 111 mmol/L 108 99 105  CO2 22 - 32 mmol/L 28 26 28   Calcium 8.9 - 10.3 mg/dL 8.8(L) 9.5 9.8  Total Protein 6.5 - 8.1 g/dL 6.3(L) 8.0 -  Total Bilirubin 0.3 - 1.2 mg/dL 1.0 1.0 -  Alkaline Phos 38 - 126 U/L 77 115 -  AST 15 - 41 U/L 19 25 -  ALT 0 - 44 U/L 19 25 -      Imaging: Radiology review:  CLINICAL DATA:  Abdominal distension, abdominal and pelvic pain since this morning, increased cramping in LEFT leg, history hypertension  EXAM: CT ABDOMEN AND PELVIS WITH CONTRAST  TECHNIQUE: Multidetector CT imaging of the abdomen and pelvis was performed using the standard protocol following bolus administration of intravenous contrast. Sagittal and coronal MPR images reconstructed from axial data set.  CONTRAST:  160mL OMNIPAQUE IOHEXOL 300 MG/ML SOLN IV. No oral contrast.  COMPARISON:  10/22/2012  FINDINGS: Lower chest: Minimal dependent atelectasis at lung bases  Hepatobiliary: Tiny cyst lateral liver 4 mm diameter image 19. Gallbladder and liver otherwise normal appearance  Pancreas: Normal appearance  Spleen: Normal appearance  Adrenals/Urinary Tract: Adrenal glands, kidneys, and ureters normal appearance. Small cystocele; bladder otherwise unremarkable.  Stomach/Bowel: Nonobstructed segment of sigmoid colon extends into a moderate-sized LEFT inguinal hernia containing fat. Mobile cecum located at midline in upper to mid pelvis. Appendix not identified. Moderate-sized hiatal hernia containing fluid. Small-bowel obstruction  secondary to multiple loops of distal ileum within a large RIGHT inguinal hernia. Associated wall thickening of involved small bowel loops mesenteric edema and free fluid suggesting incarceration. Associated small bowel stool sign. Mucosal enhancement is seen within the small bowel as well as within mesenteric vessels within the hernia. Dilatation of small bowel loops proximal to the hernia with associated mesenteric edema. No evidence of perforation.  Vascular/Lymphatic: Vascular structures patent. Minimal atherosclerotic calcification aorta without aneurysm. No adenopathy. Few scattered normal size mesenteric lymph nodes seen.  Reproductive: Uterus surgically absent. Nonvisualization of LEFT ovary with normal appearing RIGHT ovary.  Other: Free fluid perihepatic and perisplenic as well as within RIGHT inguinal hernia sac. No free air. Umbilical hernia containing fat. BILATERAL inguinal hernias as discussed above.  Musculoskeletal: Joint space narrowing of the hip joints bilaterally. No acute osseous findings.  IMPRESSION: Small-bowel obstruction due to multiple loops of distal ileum in a  large RIGHT inguinal hernia.  Associated bowel wall thickening of involved small bowel loops, mesenteric edema and free fluid suggesting incarceration.  No evidence of perforation.  Small bowel loops proximal to the hernia are dilated with minimal wall thickening and extensive mesenteric edema consistent with obstruction.  No definite evidence of bowel ischemia identified though not excluded.  Moderate-sized hiatal hernia.  Small umbilical hernia containing fat.  Moderate-sized LEFT inguinal hernia containing nonobstructed sigmoid colon segment.  Small cystocele.  Aortic Atherosclerosis (ICD10-I70.0).  Findings called to Dr. Cyril Loosen on 06/08/2019 at 1350 hours.   Electronically Signed   By: Ulyses Southward M.D.   On: 06/08/2019 13:52  Within last 24 hrs: No  results found.  Assessment    Bilateral incarcerated inguinal hernias. Relatively speaking rather small umbilical hernia. Patient Active Problem List   Diagnosis Date Noted  . Hernia of abdominal cavity 06/08/2019  . Essential hypertension 11/18/2014  . Headache 11/18/2014  . Trouble swallowing 11/18/2014  . Mass of right inguinal region 11/18/2014    Plan    Robotic repair of rather large bilateral incarcerated inguinal hernias. Her umbilical hernia is rather asymptomatic and may be deferred for a different procedure considering the extent of the groin hernias were dealing with. I discussed possibility of incarceration, strangulation, enlargement in size over time, and the risk of emergency surgery in the face of strangulation.  Also discussed the risk of surgery including recurrence which can be up to 30% in the case of complex hernias, use of prosthetic materials (mesh) and the increased risk of infxn, post-op infxn and the possible need for re-operation and removal of mesh if used, possibility of post-op SBO or ileus, and the risks of general anesthetic including MI, CVA, sudden death or even reaction to anesthetic medications. The patient and family understands the risks, any and all questions were answered to the patient's satisfaction.   Face-to-face time spent with the patient and accompanying care providers(if present) was 45 minutes, with more than 50% of the time spent counseling, educating, and coordinating care of the patient.      Campbell Lerner M.D., FACS 06/19/2019, 11:13 AM

## 2019-06-23 ENCOUNTER — Encounter
Admission: RE | Disposition: A | Discharge status: Home / Self Care | Payer: Self-pay | Source: Home / Self Care | Attending: Surgery

## 2019-06-23 ENCOUNTER — Encounter: Payer: Self-pay | Admitting: Surgery

## 2019-06-23 ENCOUNTER — Other Ambulatory Visit: Payer: Self-pay

## 2019-06-23 ENCOUNTER — Ambulatory Visit: Payer: 59 | Admitting: Certified Registered Nurse Anesthetist

## 2019-06-23 ENCOUNTER — Ambulatory Visit
Admission: RE | Admit: 2019-06-23 | Discharge: 2019-06-23 | Disposition: A | Discharge status: Home / Self Care | Payer: 59 | Attending: Surgery | Admitting: Surgery

## 2019-06-23 DIAGNOSIS — K4001 Bilateral inguinal hernia, with obstruction, without gangrene, recurrent: Secondary | ICD-10-CM | POA: Diagnosis not present

## 2019-06-23 DIAGNOSIS — K469 Unspecified abdominal hernia without obstruction or gangrene: Secondary | ICD-10-CM

## 2019-06-23 DIAGNOSIS — I10 Essential (primary) hypertension: Secondary | ICD-10-CM | POA: Diagnosis not present

## 2019-06-23 DIAGNOSIS — Z20822 Contact with and (suspected) exposure to covid-19: Secondary | ICD-10-CM | POA: Insufficient documentation

## 2019-06-23 DIAGNOSIS — Z886 Allergy status to analgesic agent status: Secondary | ICD-10-CM | POA: Insufficient documentation

## 2019-06-23 DIAGNOSIS — K4000 Bilateral inguinal hernia, with obstruction, without gangrene, not specified as recurrent: Secondary | ICD-10-CM | POA: Diagnosis not present

## 2019-06-23 DIAGNOSIS — Z8249 Family history of ischemic heart disease and other diseases of the circulatory system: Secondary | ICD-10-CM | POA: Insufficient documentation

## 2019-06-23 HISTORY — PX: XI ROBOTIC ASSISTED INGUINAL HERNIA REPAIR WITH MESH: SHX6706

## 2019-06-23 SURGERY — REPAIR, HERNIA, INGUINAL, ROBOT-ASSISTED, LAPAROSCOPIC, USING MESH
Anesthesia: General | Laterality: Bilateral

## 2019-06-23 MED ORDER — IPRATROPIUM-ALBUTEROL 0.5-2.5 (3) MG/3ML IN SOLN
RESPIRATORY_TRACT | Status: AC
  Filled 2019-06-23: qty 3

## 2019-06-23 MED ORDER — GABAPENTIN 300 MG PO CAPS: 300.0000 mg | ORAL_CAPSULE | ORAL | Status: AC

## 2019-06-23 MED ORDER — IPRATROPIUM-ALBUTEROL 0.5-2.5 (3) MG/3ML IN SOLN
RESPIRATORY_TRACT | Status: AC
  Administered 2019-06-23: 3 mL via RESPIRATORY_TRACT
  Filled 2019-06-23: qty 3

## 2019-06-23 MED ORDER — FENTANYL CITRATE (PF) 100 MCG/2ML IJ SOLN
INTRAMUSCULAR | Status: DC | PRN
  Administered 2019-06-23 (×4): 25 ug via INTRAVENOUS

## 2019-06-23 MED ORDER — BUPIVACAINE-EPINEPHRINE (PF) 0.25% -1:200000 IJ SOLN
INTRAMUSCULAR | Status: DC | PRN
  Administered 2019-06-23 (×2): 15 mL

## 2019-06-23 MED ORDER — SUGAMMADEX SODIUM 200 MG/2ML IV SOLN
INTRAVENOUS | Status: DC | PRN
  Administered 2019-06-23: 60 mg via INTRAVENOUS
  Administered 2019-06-23 (×2): 30 mg via INTRAVENOUS
  Administered 2019-06-23: 40 mg via INTRAVENOUS

## 2019-06-23 MED ORDER — FENTANYL CITRATE (PF) 100 MCG/2ML IJ SOLN
25.0000 ug | INTRAMUSCULAR | Status: DC | PRN
  Administered 2019-06-23: 25 ug via INTRAVENOUS

## 2019-06-23 MED ORDER — EPINEPHRINE PF 1 MG/ML IJ SOLN
INTRAMUSCULAR | Status: AC
  Filled 2019-06-23: qty 1

## 2019-06-23 MED ORDER — CEFAZOLIN SODIUM 1 G IJ SOLR
INTRAMUSCULAR | Status: AC
  Filled 2019-06-23: qty 20

## 2019-06-23 MED ORDER — BUPIVACAINE LIPOSOME 1.3 % IJ SUSP: 20.0000 mL | Freq: Once | INTRAMUSCULAR | Status: DC

## 2019-06-23 MED ORDER — BUPIVACAINE HCL (PF) 0.25 % IJ SOLN
INTRAMUSCULAR | Status: AC
  Filled 2019-06-23: qty 30

## 2019-06-23 MED ORDER — HYDROCODONE-ACETAMINOPHEN 5-325 MG PO TABS: 2.0000 | ORAL_TABLET | Freq: Four times a day (QID) | ORAL | 0 refills | Status: DC | PRN

## 2019-06-23 MED ORDER — LIDOCAINE HCL (PF) 2 % IJ SOLN
INTRAMUSCULAR | Status: AC
  Filled 2019-06-23: qty 5

## 2019-06-23 MED ORDER — DEXMEDETOMIDINE HCL IN NACL 80 MCG/20ML IV SOLN
INTRAVENOUS | Status: AC
  Filled 2019-06-23: qty 20

## 2019-06-23 MED ORDER — CEFAZOLIN SODIUM-DEXTROSE 2-4 GM/100ML-% IV SOLN
2.0000 g | INTRAVENOUS | Status: AC
  Administered 2019-06-23 (×2): 2 g via INTRAVENOUS

## 2019-06-23 MED ORDER — ACETAMINOPHEN 500 MG PO TABS
ORAL_TABLET | ORAL | Status: AC
  Administered 2019-06-23: 1000 mg via ORAL
  Filled 2019-06-23: qty 2

## 2019-06-23 MED ORDER — IPRATROPIUM-ALBUTEROL 0.5-2.5 (3) MG/3ML IN SOLN: 3.0000 mL | Freq: Once | RESPIRATORY_TRACT | Status: AC

## 2019-06-23 MED ORDER — CEFAZOLIN SODIUM-DEXTROSE 2-4 GM/100ML-% IV SOLN
INTRAVENOUS | Status: AC
  Filled 2019-06-23: qty 100

## 2019-06-23 MED ORDER — DEXAMETHASONE SODIUM PHOSPHATE 10 MG/ML IJ SOLN
INTRAMUSCULAR | Status: AC
  Filled 2019-06-23: qty 1

## 2019-06-23 MED ORDER — PROPOFOL 10 MG/ML IV BOLUS
INTRAVENOUS | Status: DC | PRN
  Administered 2019-06-23 (×2): 10 mg via INTRAVENOUS
  Administered 2019-06-23: 120 mg via INTRAVENOUS
  Administered 2019-06-23: 10 mg via INTRAVENOUS

## 2019-06-23 MED ORDER — ROCURONIUM BROMIDE 10 MG/ML (PF) SYRINGE
PREFILLED_SYRINGE | INTRAVENOUS | Status: AC
  Filled 2019-06-23: qty 10

## 2019-06-23 MED ORDER — LIDOCAINE HCL (CARDIAC) PF 100 MG/5ML IV SOSY
PREFILLED_SYRINGE | INTRAVENOUS | Status: DC | PRN
  Administered 2019-06-23: 60 mg via INTRAVENOUS

## 2019-06-23 MED ORDER — ACETAMINOPHEN 500 MG PO TABS: 1000.0000 mg | ORAL_TABLET | ORAL | Status: AC

## 2019-06-23 MED ORDER — DEXAMETHASONE SODIUM PHOSPHATE 10 MG/ML IJ SOLN
INTRAMUSCULAR | Status: DC | PRN
  Administered 2019-06-23: 10 mg via INTRAVENOUS

## 2019-06-23 MED ORDER — GLYCOPYRROLATE 0.2 MG/ML IJ SOLN
INTRAMUSCULAR | Status: DC | PRN
  Administered 2019-06-23: .2 mg via INTRAVENOUS

## 2019-06-23 MED ORDER — GLYCOPYRROLATE 0.2 MG/ML IJ SOLN
INTRAMUSCULAR | Status: AC
  Filled 2019-06-23: qty 1

## 2019-06-23 MED ORDER — CHLORHEXIDINE GLUCONATE CLOTH 2 % EX PADS: 6.0000 | MEDICATED_PAD | Freq: Once | CUTANEOUS | Status: DC

## 2019-06-23 MED ORDER — DEXMEDETOMIDINE HCL IN NACL 200 MCG/50ML IV SOLN
INTRAVENOUS | Status: DC | PRN
  Administered 2019-06-23 (×2): 8 ug via INTRAVENOUS

## 2019-06-23 MED ORDER — ROCURONIUM BROMIDE 100 MG/10ML IV SOLN
INTRAVENOUS | Status: DC | PRN
  Administered 2019-06-23: 10 mg via INTRAVENOUS
  Administered 2019-06-23: 15 mg via INTRAVENOUS
  Administered 2019-06-23 (×3): 10 mg via INTRAVENOUS
  Administered 2019-06-23: 65 mg via INTRAVENOUS

## 2019-06-23 MED ORDER — ONDANSETRON HCL 4 MG/2ML IJ SOLN
INTRAMUSCULAR | Status: AC
  Filled 2019-06-23: qty 2

## 2019-06-23 MED ORDER — FENTANYL CITRATE (PF) 100 MCG/2ML IJ SOLN
INTRAMUSCULAR | Status: AC
  Administered 2019-06-23: 25 ug via INTRAVENOUS
  Filled 2019-06-23: qty 2

## 2019-06-23 MED ORDER — LACTATED RINGERS IV SOLN: INTRAVENOUS | Status: DC

## 2019-06-23 MED ORDER — CEPHALEXIN 250 MG PO CAPS: 250.0000 mg | ORAL_CAPSULE | Freq: Four times a day (QID) | ORAL | 0 refills | Status: AC

## 2019-06-23 MED ORDER — ONDANSETRON HCL 4 MG/2ML IJ SOLN: 4.0000 mg | Freq: Once | INTRAMUSCULAR | Status: DC | PRN

## 2019-06-23 MED ORDER — PROPOFOL 10 MG/ML IV BOLUS
INTRAVENOUS | Status: AC
  Filled 2019-06-23: qty 40

## 2019-06-23 MED ORDER — FENTANYL CITRATE (PF) 100 MCG/2ML IJ SOLN
INTRAMUSCULAR | Status: AC
  Filled 2019-06-23: qty 2

## 2019-06-23 MED ORDER — BUPIVACAINE LIPOSOME 1.3 % IJ SUSP
INTRAMUSCULAR | Status: AC
  Filled 2019-06-23: qty 20

## 2019-06-23 MED ORDER — LACTATED RINGERS IV SOLN: INTRAVENOUS | Status: DC | PRN

## 2019-06-23 MED ORDER — GABAPENTIN 300 MG PO CAPS
ORAL_CAPSULE | ORAL | Status: AC
  Administered 2019-06-23: 300 mg via ORAL
  Filled 2019-06-23: qty 1

## 2019-06-23 MED ORDER — ONDANSETRON HCL 4 MG/2ML IJ SOLN
INTRAMUSCULAR | Status: DC | PRN
  Administered 2019-06-23: 4 mg via INTRAVENOUS

## 2019-06-23 SURGICAL SUPPLY — 49 items
BLADE CLIPPER SURG (BLADE) ×2 IMPLANT
BULB RESERV EVAC DRAIN JP 100C (MISCELLANEOUS) ×2 IMPLANT
CANISTER SUCT 1200ML W/VALVE (MISCELLANEOUS) ×2 IMPLANT
CHLORAPREP W/TINT 26 (MISCELLANEOUS) ×2 IMPLANT
COVER TIP SHEARS 8 DVNC (MISCELLANEOUS) ×1 IMPLANT
COVER TIP SHEARS 8MM DA VINCI (MISCELLANEOUS) ×1
COVER WAND RF STERILE (DRAPES) ×4 IMPLANT
DEFOGGER SCOPE WARMER CLEARIFY (MISCELLANEOUS) ×2 IMPLANT
DERMABOND ADVANCED (GAUZE/BANDAGES/DRESSINGS) ×1
DERMABOND ADVANCED .7 DNX12 (GAUZE/BANDAGES/DRESSINGS) ×1 IMPLANT
DRAIN CHANNEL JP 15F RND 16 (MISCELLANEOUS) ×2 IMPLANT
DRAPE 3/4 80X56 (DRAPES) IMPLANT
DRAPE ARM DVNC X/XI (DISPOSABLE) ×3 IMPLANT
DRAPE COLUMN DVNC XI (DISPOSABLE) ×1 IMPLANT
DRAPE DA VINCI XI ARM (DISPOSABLE) ×3
DRAPE DA VINCI XI COLUMN (DISPOSABLE) ×1
ELECT REM PT RETURN 9FT ADLT (ELECTROSURGICAL) ×2
ELECTRODE REM PT RTRN 9FT ADLT (ELECTROSURGICAL) ×1 IMPLANT
GLOVE ORTHO TXT STRL SZ7.5 (GLOVE) ×6 IMPLANT
GOWN STRL REUS W/ TWL LRG LVL3 (GOWN DISPOSABLE) ×3 IMPLANT
GOWN STRL REUS W/TWL LRG LVL3 (GOWN DISPOSABLE) ×3
GRASPER SUT TROCAR 14GX15 (MISCELLANEOUS) IMPLANT
IRRIGATION STRYKERFLOW (MISCELLANEOUS) IMPLANT
IRRIGATOR STRYKERFLOW (MISCELLANEOUS)
IV CATH ANGIO 14GX1.88 NO SAFE (IV SOLUTION) ×2 IMPLANT
IV NS 1000ML (IV SOLUTION)
IV NS 1000ML BAXH (IV SOLUTION) IMPLANT
KIT PINK PAD W/HEAD ARE REST (MISCELLANEOUS) ×2
KIT PINK PAD W/HEAD ARM REST (MISCELLANEOUS) ×1 IMPLANT
LABEL OR SOLS (LABEL) ×2 IMPLANT
MESH 3DMAX LIGHT 4.8X6.7 LT XL (Mesh General) ×2 IMPLANT
MESH 3DMAX LIGHT 4.8X6.7 RT XL (Mesh General) ×2 IMPLANT
NEEDLE HYPO 22GX1.5 SAFETY (NEEDLE) ×2 IMPLANT
NEEDLE INSUFFLATION 14GA 120MM (NEEDLE) ×2 IMPLANT
PACK LAP CHOLECYSTECTOMY (MISCELLANEOUS) ×2 IMPLANT
SCISSORS METZENBAUM CVD 33 (INSTRUMENTS) ×2 IMPLANT
SEAL CANN UNIV 5-8 DVNC XI (MISCELLANEOUS) ×3 IMPLANT
SEAL XI 5MM-8MM UNIVERSAL (MISCELLANEOUS) ×3
SET TUBE SMOKE EVAC HIGH FLOW (TUBING) ×2 IMPLANT
SOLUTION ELECTROLUBE (MISCELLANEOUS) ×2 IMPLANT
SPONGE DRAIN TRACH 4X4 STRL 2S (GAUZE/BANDAGES/DRESSINGS) ×4 IMPLANT
SUT ETHILON 3-0 FS-10 30 BLK (SUTURE) ×2
SUT MNCRL AB 4-0 PS2 18 (SUTURE) ×2 IMPLANT
SUT VIC AB 3-0 SH 27 (SUTURE) ×1
SUT VIC AB 3-0 SH 27X BRD (SUTURE) ×1 IMPLANT
SUT VICRYL 0 AB UR-6 (SUTURE) ×2 IMPLANT
SUT VLOC 90 S/L VL9 GS22 (SUTURE) ×2 IMPLANT
SUTURE EHLN 3-0 FS-10 30 BLK (SUTURE) ×1 IMPLANT
TROCAR Z-THREAD FIOS 11X100 BL (TROCAR) ×2 IMPLANT

## 2019-06-23 NOTE — Anesthesia Procedure Notes (Signed)
Procedure Name: Intubation Date/Time: 06/23/2019 7:41 AM Performed by: Gayland Curry, CRNA Pre-anesthesia Checklist: Patient identified, Emergency Drugs available, Suction available and Patient being monitored Patient Re-evaluated:Patient Re-evaluated prior to induction Oxygen Delivery Method: Circle system utilized Preoxygenation: Pre-oxygenation with 100% oxygen Induction Type: IV induction Ventilation: Mask ventilation without difficulty Laryngoscope Size: Mac and 3 Grade View: Grade I Tube type: Oral Tube size: 7.0 mm Number of attempts: 1 Placement Confirmation: ETT inserted through vocal cords under direct vision,  positive ETCO2 and breath sounds checked- equal and bilateral Secured at: 21 cm Tube secured with: Tape Dental Injury: Teeth and Oropharynx as per pre-operative assessment

## 2019-06-23 NOTE — Transfer of Care (Signed)
Immediate Anesthesia Transfer of Care Note  Patient: Kathryn Cameron  Procedure(s) Performed: XI ROBOTIC ASSISTED Bilateral Incarcerated INGUINAL HERNIA REPAIR WITH MESH (Bilateral )  Patient Location: PACU  Anesthesia Type:General  Level of Consciousness: drowsy and patient cooperative  Airway & Oxygen Therapy: Patient Spontanous Breathing and Patient connected to nasal cannula oxygen  Post-op Assessment: Report given to RN and Post -op Vital signs reviewed and stable  Post vital signs: Reviewed and stable  Last Vitals:  Vitals Value Taken Time  BP 159/79 06/23/19 1156  Temp    Pulse 80 06/23/19 1158  Resp 13 06/23/19 1158  SpO2 94 % 06/23/19 1158  Vitals shown include unvalidated device data.  Last Pain:  Vitals:   06/23/19 0617  TempSrc: Oral  PainSc: 2          Complications: No apparent anesthesia complications

## 2019-06-23 NOTE — Discharge Instructions (Signed)

## 2019-06-23 NOTE — Interval H&P Note (Signed)
History and Physical Interval Note:  06/23/2019 7:21 AM  Kathryn Cameron  has presented today for surgery, with the diagnosis of Bilateral incarcerated inguinal hernia repair.  The various methods of treatment have been discussed with the patient and family. After consideration of risks, benefits and other options for treatment, the patient has consented to  Procedure(s): XI ROBOTIC ASSISTED Bilateral Incarcerated INGUINAL HERNIA REPAIR WITH MESH (Bilateral) as a surgical intervention.  The patient's history has been reviewed, patient examined, no change in status, stable for surgery.  I have reviewed the patient's chart and labs.  Questions were answered to the patient's satisfaction.     Kathryn Cameron

## 2019-06-23 NOTE — Op Note (Signed)
Robotic assisted Laparoscopic Transabdominal  bilateral incarcerated inguinal Hernia Repairs with Mesh       Pre-operative Diagnosis:   Bilateral incarcerated inguinal Hernias   Post-operative Diagnosis: Same   Procedure: Robotic assisted Laparoscopic  repair of bilateral incarcerated inguinal hernia(s)   Surgeon: Campbell Lerner, M.D., FACS   Anesthesia: GETA   Findings:  Bilateral incarcerated inguinal hernias.         Procedure Details  The patient was seen again in the Holding Room. The benefits, complications, treatment options, and expected outcomes were discussed with the patient. The risks of bleeding, infection, recurrence of symptoms, failure to resolve symptoms, recurrence of hernia, ischemic orchitis, chronic pain syndrome or neuroma, were reviewed again. The likelihood of improving the patient's symptoms with return to their baseline status is good.  The patient and/or family concurred with the proposed plan, giving informed consent.  The patient was taken to Operating Room, identified  and the procedure verified as Laparoscopic Inguinal Hernia Repair. Laterality confirmed.  A Time Out was held and the above information confirmed.   Prior to the induction of general anesthesia, antibiotic prophylaxis was administered. VTE prophylaxis was in place. General endotracheal anesthesia was then administered and tolerated well. After the induction, the abdomen was prepped with Chloraprep and draped in the sterile fashion. The patient was positioned in the supine position.   After local infiltration of quarter percent Marcaine with epinephrine, stab incision was made left upper quadrant.  Just below the costal margin approximately midclavicular line the Veress needle is passed with sensation of the layers to penetrate the abdominal wall and into the peritoneum.  Saline drop test is confirmed peritoneal placement.  Insufflation is initiated with carbon dioxide to pressures of 15 mmHg. An  8.5 mm port is placed to the left off of the midline, with blunt tipped trocar.  Pneumoperitoneum maintained w/o HD changes. No evidence of bowel injuries.  Two 8.5 mm ports placed under direct vision. The laparoscopy revealed large bilateral indirect defects, both with bowel incarcerated.   The robot was brought ot the table and docked in the standard fashion, no collision between arms was observed. Instruments were kept under direct view at all times. My bedside assistant assisted with pressure to the right groin and labia, and with 2 fenestrated graspers were able to reduce the right and left inguinal hernias. For bilateral inguinal hernia repair,  I developed a peritoneal flap. The right sac was extensively large within the subcutaneous tissues of the right labia, I attempted to obtain full reduction, however due to extensive density of adhesion there was way too much surface area to address, I chose then to leave the vast majority of the intralabial hernia sac, and utilize a drain to help prevent a seroma from forming there.  The majority of the hernia neck, and part of the sac distal to this was reduced and dissected free from adjacent structures.  I utilized the fenestrated bipolar on the round ligament and proceeded with its division with monopolar scissors due to the third degree of laxity and the dense adhesive nature to the retroperitoneal tissues, then I was able to create a posterior pocket for the mesh well beyond in the retroperitoneum. Once dissection was completed an extra-large right sided BARD 3D Light mesh was placed, we then passed a 15 Blake drain and placed it within theright labial hernia sac.  Then I secured the mesh overlying the drain at three points with interrupted 2-0 Vicryl to the pubic tubercle and anteriorly.  There was good coverage of the direct, indirect and femoral spaces. Hemostasis was confirmed. Second look revealed no complications or injuries. I then completed the  development of the peritoneal flap on the left side. Attention then was turned to the opposite side. The sac was able to be reduced and dissected free from adjacent structures.  Again, I felt it prudent to divide the round ligament, in like manner completed an adequate posterior dissection. Once dissection was completed the left extra-large Bard 3D mesh light, but contralateral mesh was placed and secured in like manner with interrupted 2-0 Vicryl. There was good coverage of the direct, indirect and femoral spaces.  The flap was then closed with two 3-0 V-lock sutures.  Peritoneal closure completed with the additional V-Loc suture on the left, and additional Vicryl for peritoneal closure to close the right side, both sides inspected and both are without defects.  Once assuring that hemostasis was adequate, all needles/sponges removed, the drain was withdrawn out of the right upper quadrant trocar, and the robot was undocked.  The ports were removed, the abdomen desulflated.  4-0 subcuticular Monocryl was used at all skin edges.   3-0 nylon drain stitch utilized in the right upper quadrant.  Dermabond was applied.  Patient tolerated the procedure well. There were no complications.  She was taken to the recovery room in stable condition.           Ronny Bacon, M.D., FACS 06/23/2019, 11:56 AM

## 2019-06-23 NOTE — Interval H&P Note (Signed)
History and Physical Interval Note:  06/23/2019 7:21 AM  Kathryn Cameron  has presented today for surgery, with the diagnosis of Bilateral incarcerated inguinal hernia repair.  The various methods of treatment have been discussed with the patient and family. After consideration of risks, benefits and other options for treatment, the patient has consented to  Procedure(s): XI ROBOTIC ASSISTED Bilateral Incarcerated INGUINAL HERNIA REPAIR WITH MESH (Bilateral) as a surgical intervention.  The patient's history has been reviewed, patient examined, no change in status, stable for surgery.  I have reviewed the patient's chart and labs.  Questions were answered to the patient's satisfaction.     Campbell Lerner, M.D., [facility]  06/23/2019 ; 7:22 AM

## 2019-06-23 NOTE — Anesthesia Preprocedure Evaluation (Signed)
Anesthesia Evaluation  Patient identified by MRN, date of birth, ID band Patient awake    Reviewed: Allergy & Precautions, H&P , NPO status , Patient's Chart, lab work & pertinent test results, reviewed documented beta blocker date and time   Airway Mallampati: II  TM Distance: >3 FB Neck ROM: full    Dental  (+) Teeth Intact   Pulmonary neg pulmonary ROS,    Pulmonary exam normal        Cardiovascular Exercise Tolerance: Good hypertension, On Medications negative cardio ROS Normal cardiovascular exam Rhythm:regular Rate:Normal     Neuro/Psych  Headaches, negative psych ROS   GI/Hepatic negative GI ROS, Neg liver ROS,   Endo/Other  negative endocrine ROS  Renal/GU negative Renal ROS  negative genitourinary   Musculoskeletal   Abdominal   Peds  Hematology negative hematology ROS (+)   Anesthesia Other Findings Past Medical History: No date: Chicken pox No date: Hypertension Past Surgical History: No date: LAPAROSCOPIC HYSTERECTOMY     Comment:  left one ovary in place BMI    Body Mass Index: 29.58 kg/m     Reproductive/Obstetrics negative OB ROS                             Anesthesia Physical Anesthesia Plan  ASA: II  Anesthesia Plan: General ETT   Post-op Pain Management:    Induction:   PONV Risk Score and Plan:   Airway Management Planned:   Additional Equipment:   Intra-op Plan:   Post-operative Plan:   Informed Consent: I have reviewed the patients History and Physical, chart, labs and discussed the procedure including the risks, benefits and alternatives for the proposed anesthesia with the patient or authorized representative who has indicated his/her understanding and acceptance.     Dental Advisory Given  Plan Discussed with: CRNA  Anesthesia Plan Comments:         Anesthesia Quick Evaluation

## 2019-06-26 ENCOUNTER — Ambulatory Visit (INDEPENDENT_AMBULATORY_CARE_PROVIDER_SITE_OTHER): Payer: Self-pay | Admitting: Surgery

## 2019-06-26 ENCOUNTER — Encounter: Payer: Self-pay | Admitting: Surgery

## 2019-06-26 ENCOUNTER — Other Ambulatory Visit: Payer: Self-pay

## 2019-06-26 VITALS — BP 161/94 | HR 97 | Temp 97.3°F | Ht 63.0 in | Wt 165.6 lb

## 2019-06-26 DIAGNOSIS — Z8719 Personal history of other diseases of the digestive system: Secondary | ICD-10-CM | POA: Insufficient documentation

## 2019-06-26 DIAGNOSIS — Z9889 Other specified postprocedural states: Secondary | ICD-10-CM

## 2019-06-26 NOTE — Patient Instructions (Signed)
Dr.Rodenberg recommends patient to try over the counter Miralax twice a day to help with constipation.  Dr.Rodenberg removed patient's drain at today's visit for patient.   Patient may take Ibuprofen or Tylenol to help with the pain and discomfort or any inflammation.  Patient advised to apply dry gauze to the area. Patient may resume showering starting Saturday.

## 2019-06-26 NOTE — Progress Notes (Signed)
[facility] POST-OP OFFICE VISIT  06/26/2019  HPI: Kathryn Cameron is a 64 y.o. female 3 days s/p robotic bilateral inguinal hernia repairs.  Her drain output has become serous.  Diminished.  She forgot to bring the record of her output.  But regardless she is here for drain removal.  She reports diminished appetite.  She reports constipation having no bowel movement since preop.  She denies fever, chills and vomiting.  He does have some nausea with her anorexia.  She has taken all of her pain medications to date.  Vital signs: BP (!) 161/94   Pulse 97   Temp (!) 97.3 F (36.3 C) (Temporal)   Ht 5\' 3"  (1.6 m)   Wt 165 lb 9.6 oz (75.1 kg)   SpO2 96%   BMI 29.33 kg/m    Physical Exam: Constitutional: She appears well, avoiding laying down for examination. Abdomen: Soft nontender.  Right upper quadrant drain as expected.  I removed it without difficulty.  The drainage in the bulb and tubing has become serous. Skin: All incisions, are clean without any evidence of erythema or significant ecchymosis.  Dermabond is intact.  Assessment/Plan: This is a 64 y.o. female 3 days s/p bilateral inguinal hernia repairs, robotic.  I have left a drain due to the residual large hernia sac within the patient's right labia.  My intention was to remove the drain today due to its proximity to mesh.  Patient Active Problem List   Diagnosis Date Noted  . Bilateral incarcerated inguinal hernia 06/19/2019  . Hernia of abdominal cavity 06/08/2019  . Essential hypertension 11/18/2014  . Headache 11/18/2014  . Trouble swallowing 11/18/2014  . Mass of right inguinal region 11/18/2014    -Drain removed successfully.  Patient advised to take as much MiraLAX as necessary to get her bowels moving again.  Pain medication will be transferred to over-the-counter medications such as ibuprofen and Tylenol at this time.  She has been advised not to drive. Strongly advised against any form of lifting  greater than 15 pounds over the next 4 to 6 weeks.  This involves pushing/pulling movements as well.  After 4 weeks when may gradually engage in more activities remaining aware of any new pain/tenderness elicited, and avoiding those for the full duration of 6 weeks.  Walking is encouraged.  Climbing stairs with caution.    01/18/2015 M.D., FACS 06/26/2019, 10:08 AM

## 2019-06-27 NOTE — Anesthesia Postprocedure Evaluation (Signed)
Anesthesia Post Note  Patient: Kathryn Cameron  Procedure(s) Performed: XI ROBOTIC ASSISTED Bilateral Incarcerated INGUINAL HERNIA REPAIR WITH MESH (Bilateral )  Patient location during evaluation: PACU Anesthesia Type: General Level of consciousness: awake and alert Pain management: pain level controlled Vital Signs Assessment: post-procedure vital signs reviewed and stable Respiratory status: spontaneous breathing, nonlabored ventilation, respiratory function stable and patient connected to nasal cannula oxygen Cardiovascular status: blood pressure returned to baseline and stable Postop Assessment: no apparent nausea or vomiting Anesthetic complications: no     Last Vitals:  Vitals:   06/23/19 1550 06/23/19 1630  BP: (!) 164/87 (!) 170/82  Pulse: 100 100  Resp: 14 16  Temp: (!) 36.2 C   SpO2: 96% 94%    Last Pain:  Vitals:   06/24/19 1124  TempSrc:   PainSc: 4                  Yevette Edwards

## 2019-07-02 ENCOUNTER — Telehealth: Payer: Self-pay | Admitting: *Deleted

## 2019-07-02 NOTE — Telephone Encounter (Signed)
Faxed FMLA to [employer] [phone]  

## 2019-07-10 ENCOUNTER — Encounter: Payer: Self-pay | Admitting: Surgery

## 2019-07-10 ENCOUNTER — Ambulatory Visit (INDEPENDENT_AMBULATORY_CARE_PROVIDER_SITE_OTHER): Payer: Self-pay | Admitting: Surgery

## 2019-07-10 ENCOUNTER — Other Ambulatory Visit: Payer: Self-pay

## 2019-07-10 VITALS — BP 204/112 | HR 83 | Temp 97.7°F | Resp 12 | Ht 63.0 in | Wt 166.0 lb

## 2019-07-10 DIAGNOSIS — Z9889 Other specified postprocedural states: Secondary | ICD-10-CM

## 2019-07-10 DIAGNOSIS — Z8719 Personal history of other diseases of the digestive system: Secondary | ICD-10-CM

## 2019-07-10 NOTE — Progress Notes (Addendum)
[facility] POST-OP OFFICE VISIT  07/10/2019  HPI: Kathryn Cameron is a 64 y.o. female 16 days s/p robotic bilateral inguinal hernia repair of a giant right inguinal hernia and a large left inguinal hernia.  She reports sporadic bloating.  She reports a somewhat diminished appetite.  She denies pain, or burning sensations in the groin area.  She reports improving regularity with her bowels with initiation of MiraLAX.  She denies any drainage or redness from any of her incisions.  She denies any fever, chills, nausea or vomiting.  She reports she is not waking up at night with pain or discomfort.  She also reports that her voiding is less frequent and she is not awakened at night to void frequently.  For this she is quite pleased.  Vital signs: BP (!) 204/112   Pulse 83   Temp 97.7 F (36.5 C)   Resp 12   Ht 5\' 3"  (1.6 m)   Wt 166 lb (75.3 kg)   SpO2 95%   BMI 29.41 kg/m    Physical Exam: Constitutional: She appears well.  She reports she has a much easier time donning and doffing her pants. Abdomen: Rounded, nontender. Skin: Incisions are clean, dry and intact. Her right groin still has the expected enlargement considering the massive size of her right hernia, there is a minimal pseudohernia present, this is entirely expected considering the amount of residual hernia sac that was left from surgery.  There is no shifting with Valsalva, it is nontender.  On the left side there is also a much smaller residual pseudohernia, this also does not shift or change with cough.  Both of these are to be expected considering the sizes of the hernias present.  Both areas are nontender.  Assessment/Plan: This is a 64 y.o. female 16 days s/p robotic repair of bilateral inguinal hernias, large with the right side extending into the right labia.  Findings are as expected.  I believe she is doing well.  Patient Active Problem List   Diagnosis Date Noted  . Status post laparoscopic  herniorrhaphy 06/26/2019  . Essential hypertension 11/18/2014  . Headache 11/18/2014  . Trouble swallowing 11/18/2014    -We reviewed keeping her bowels regularly with the addition of fiber.  Encouraged her to continue utilizing MiraLAX to avoid straining.  She returns to work in less than 3 weeks.  I would like to see her in the second week of July to assess her progress.  Her hypertension is once again noted, and she has been advised to follow-up with her primary care ASAP.   August M.D., FACS 07/10/2019, 11:15 AM

## 2019-07-10 NOTE — Patient Instructions (Signed)
Continue taking the miralax. Also suggested to start taking fiber supplements.   Please see your appointment below. Call the office if you have any questions or concerns.

## 2019-07-15 ENCOUNTER — Telehealth: Payer: Self-pay | Admitting: Surgery

## 2019-07-15 NOTE — Telephone Encounter (Signed)
Patient is calling and is making sure she is able to go back to work on June 20th, please call patient and advise.

## 2019-07-15 NOTE — Telephone Encounter (Signed)
Spoke with patient to notify per Dr.Rodenberg she is OK to go back to work on the 20th of June. Patient ask did she need to keep scheduled appointment for first week of July. I informed patient to keep scheduled appointment, at that appointment will be after patient's six weeks and to follow up with patient recovery progress. Patient verbalized understanding and has no further questions.

## 2019-07-30 DIAGNOSIS — I10 Essential (primary) hypertension: Secondary | ICD-10-CM | POA: Diagnosis not present

## 2019-07-31 DIAGNOSIS — Z1389 Encounter for screening for other disorder: Secondary | ICD-10-CM | POA: Diagnosis not present

## 2019-07-31 DIAGNOSIS — Z0000 Encounter for general adult medical examination without abnormal findings: Secondary | ICD-10-CM | POA: Diagnosis not present

## 2019-07-31 DIAGNOSIS — Z131 Encounter for screening for diabetes mellitus: Secondary | ICD-10-CM | POA: Diagnosis not present

## 2019-07-31 DIAGNOSIS — Z13220 Encounter for screening for lipoid disorders: Secondary | ICD-10-CM | POA: Diagnosis not present

## 2019-08-12 ENCOUNTER — Encounter: Payer: 59 | Admitting: Surgery

## 2019-08-13 DIAGNOSIS — I10 Essential (primary) hypertension: Secondary | ICD-10-CM | POA: Diagnosis not present

## 2019-08-14 ENCOUNTER — Encounter: Payer: Self-pay | Admitting: Surgery

## 2019-08-14 ENCOUNTER — Ambulatory Visit (INDEPENDENT_AMBULATORY_CARE_PROVIDER_SITE_OTHER): Payer: Self-pay | Admitting: Surgery

## 2019-08-14 ENCOUNTER — Other Ambulatory Visit: Payer: Self-pay

## 2019-08-14 VITALS — BP 146/85 | HR 85 | Temp 98.4°F | Ht 63.0 in | Wt 161.6 lb

## 2019-08-14 DIAGNOSIS — Z8719 Personal history of other diseases of the digestive system: Secondary | ICD-10-CM

## 2019-08-14 DIAGNOSIS — Z9889 Other specified postprocedural states: Secondary | ICD-10-CM

## 2019-08-14 NOTE — Progress Notes (Signed)
[facility] POST-OP OFFICE VISIT  08/14/2019  HPI: Kathryn Cameron is a 64 y.o. female over 7 weeks s/p robotic repair of bilateral inguinal hernias, large, chronically incarcerated.  She is doing very well.  She reports her bowels are being very loose denying any constipation whatsoever, she is quite resistant to my encouragement of adding fiber supplements to her diet.  Overall her bladder function is much improved she is able to sleep through the night without a frequency and urgency with voiding.  Vital signs: BP (!) 146/85   Pulse 85   Temp 98.4 F (36.9 C) (Temporal)   Ht 5\' 3"  (1.6 m)   Wt 161 lb 9.6 oz (73.3 kg)   SpO2 94%   BMI 28.63 kg/m    Physical Exam: Constitutional: She appears good. Abdomen: Soft and nontender.  Her incisions are all healed nicely.  As she notes there are golf ball sized likely pseudohernia seromas in both groin areas.  They are very mobile, irreducible, and do not change with Valsalva.  They are also nontender.   Skin: Clean, dry and intact.  Assessment/Plan: This is a 64 y.o. female over 7 weeks s/p bilateral inguinal hernia repairs.  Patient Active Problem List   Diagnosis Date Noted  . Status post laparoscopic herniorrhaphy 06/26/2019  . Essential hypertension 11/18/2014  . Headache 11/18/2014  . Trouble swallowing 11/18/2014    -We will have no further routine follow-up, however I have asked her to please consult 01/18/2015 should anything change regarding her groin/pain discomfort or swelling.   [place] M.D., FACS 08/14/2019, 10:50 AM

## 2019-08-14 NOTE — Patient Instructions (Addendum)
Patient recommended to try Fiber with her diet to help with bowels.  Follow-up with our office as needed.  Please call and ask to speak with a nurse if you develop questions or concerns.  GENERAL POST-OPERATIVE PATIENT INSTRUCTIONS   WOUND CARE INSTRUCTIONS:  Keep a dry clean dressing on the wound if there is drainage. The initial bandage may be removed after 24 hours.  Once the wound has quit draining you may leave it open to air.  If clothing rubs against the wound or causes irritation and the wound is not draining you may cover it with a dry dressing during the daytime.  Try to keep the wound dry and avoid ointments on the wound unless directed to do so.  If the wound becomes bright red and painful or starts to drain infected material that is not clear, please contact your physician immediately.  If the wound is mildly pink and has a thick firm ridge underneath it, this is normal, and is referred to as a healing ridge.  This will resolve over the next 4-6 weeks.  BATHING: You may shower if you have been informed of this by your surgeon. However, Please do not submerge in a tub, hot tub, or pool until incisions are completely sealed or have been told by your surgeon that you may do so.  DIET:  You may eat any foods that you can tolerate.  It is a good idea to eat a high fiber diet and take in plenty of fluids to prevent constipation.  If you do become constipated you may want to take a mild laxative or take ducolax tablets on a daily basis until your bowel habits are regular.  Constipation can be very uncomfortable, along with straining, after recent surgery.  ACTIVITY:  You are encouraged to cough and deep breath or use your incentive spirometer if you were given one, every 15-30 minutes when awake.  This will help prevent respiratory complications and low grade fevers post-operatively if you had a general anesthetic.  You may want to hug a pillow when coughing and sneezing to add additional  support to the surgical area, if you had abdominal or chest surgery, which will decrease pain during these times.  You are encouraged to walk and engage in light activity for the next two weeks.  You should not lift more than 20 pounds, until 4-6 weeks after surgery as it could put you at increased risk for complications.  Twenty pounds is roughly equivalent to a plastic bag of groceries. At that time- Listen to your body when lifting, if you have pain when lifting, stop and then try again in a few days. Soreness after doing exercises or activities of daily living is normal as you get back in to your normal routine.  MEDICATIONS:  Try to take narcotic medications and anti-inflammatory medications, such as tylenol, ibuprofen, naprosyn, etc., with food.  This will minimize stomach upset from the medication.  Should you develop nausea and vomiting from the pain medication, or develop a rash, please discontinue the medication and contact your physician.  You should not drive, make important decisions, or operate machinery when taking narcotic pain medication.  SUNBLOCK Use sun block to incision area over the next year if this area will be exposed to sun. This helps decrease scarring and will allow you avoid a permanent darkened area over your incision.  QUESTIONS:  Please feel free to call our office if you have any questions, and we will be glad  to assist you.    High-Fiber Diet Fiber, also called dietary fiber, is a type of carbohydrate that is found in fruits, vegetables, whole grains, and beans. A high-fiber diet can have many health benefits. Your health care provider may recommend a high-fiber diet to help:  Prevent constipation. Fiber can make your bowel movements more regular.  Lower your cholesterol.  Relieve the following conditions: ? Swelling of veins in the anus (hemorrhoids). ? Swelling and irritation (inflammation) of specific areas of the digestive tract (uncomplicated  diverticulosis). ? A problem of the large intestine (colon) that sometimes causes pain and diarrhea (irritable bowel syndrome, IBS).  Prevent overeating as part of a weight-loss plan.  Prevent heart disease, type 2 diabetes, and certain cancers. What is my plan? The recommended daily fiber intake in grams (g) includes:  38 g for men age 15 or younger.  30 g for men over age 50.  25 g for women age 32 or younger.  21 g for women over age 65. You can get the recommended daily intake of dietary fiber by:  Eating a variety of fruits, vegetables, grains, and beans.  Taking a fiber supplement, if it is not possible to get enough fiber through your diet. What do I need to know about a high-fiber diet?  It is better to get fiber through food sources rather than from fiber supplements. There is not a lot of research about how effective supplements are.  Always check the fiber content on the nutrition facts label of any prepackaged food. Look for foods that contain 5 g of fiber or more per serving.  Talk with a diet and nutrition specialist (dietitian) if you have questions about specific foods that are recommended or not recommended for your medical condition, especially if those foods are not listed below.  Gradually increase how much fiber you consume. If you increase your intake of dietary fiber too quickly, you may have bloating, cramping, or gas.  Drink plenty of water. Water helps you to digest fiber. What are tips for following this plan?  Eat a wide variety of high-fiber foods.  Make sure that half of the grains that you eat each day are whole grains.  Eat breads and cereals that are made with whole-grain flour instead of refined flour or white flour.  Eat brown rice, bulgur wheat, or millet instead of white rice.  Start the day with a breakfast that is high in fiber, such as a cereal that contains 5 g of fiber or more per serving.  Use beans in place of meat in soups,  salads, and pasta dishes.  Eat high-fiber snacks, such as berries, raw vegetables, nuts, and popcorn.  Choose whole fruits and vegetables instead of processed forms like juice or sauce. What foods can I eat?  Fruits Berries. Pears. Apples. Oranges. Avocado. Prunes and raisins. Dried figs. Vegetables Sweet potatoes. Spinach. Kale. Artichokes. Cabbage. Broccoli. Cauliflower. Green peas. Carrots. Squash. Grains Whole-grain breads. Multigrain cereal. Oats and oatmeal. Brown rice. Barley. Bulgur wheat. Millet. Quinoa. Bran muffins. Popcorn. Rye wafer crackers. Meats and other proteins Navy, kidney, and pinto beans. Soybeans. Split peas. Lentils. Nuts and seeds. Dairy Fiber-fortified yogurt. Beverages Fiber-fortified soy milk. Fiber-fortified orange juice. Other foods Fiber bars. The items listed above may not be a complete list of recommended foods and beverages. Contact a dietitian for more options. What foods are not recommended? Fruits Fruit juice. Cooked, strained fruit. Vegetables Fried potatoes. Canned vegetables. Well-cooked vegetables. Grains White bread. Pasta made with  refined flour. White rice. Meats and other proteins Fatty cuts of meat. Fried chicken or fried fish. Dairy Milk. Yogurt. Cream cheese. Sour cream. Fats and oils Butters. Beverages Soft drinks. Other foods Cakes and pastries. The items listed above may not be a complete list of foods and beverages to avoid. Contact a dietitian for more information. Summary  Fiber is a type of carbohydrate. It is found in fruits, vegetables, whole grains, and beans.  There are many health benefits of eating a high-fiber diet, such as preventing constipation, lowering blood cholesterol, helping with weight loss, and reducing your risk of heart disease, diabetes, and certain cancers.  Gradually increase your intake of fiber. Increasing too fast can result in cramping, bloating, and gas. Drink plenty of water while you  increase your fiber.  The best sources of fiber include whole fruits and vegetables, whole grains, nuts, seeds, and beans. This information is not intended to replace advice given to you by your health care provider. Make sure you discuss any questions you have with your health care provider. Document Revised: 11/27/2016 Document Reviewed: 11/27/2016 Elsevier Patient Education  2020 [employer].

## 2020-04-28 ENCOUNTER — Other Ambulatory Visit: Payer: Self-pay | Admitting: Family Medicine

## 2020-04-28 DIAGNOSIS — Z1231 Encounter for screening mammogram for malignant neoplasm of breast: Secondary | ICD-10-CM

## 2022-11-22 ENCOUNTER — Ambulatory Visit: Payer: Medicare HMO

## 2022-11-22 DIAGNOSIS — D124 Benign neoplasm of descending colon: Secondary | ICD-10-CM | POA: Diagnosis present

## 2022-11-22 DIAGNOSIS — K5730 Diverticulosis of large intestine without perforation or abscess without bleeding: Secondary | ICD-10-CM | POA: Diagnosis not present

## 2022-11-22 DIAGNOSIS — K621 Rectal polyp: Secondary | ICD-10-CM | POA: Diagnosis not present

## 2022-11-22 DIAGNOSIS — Z1211 Encounter for screening for malignant neoplasm of colon: Secondary | ICD-10-CM | POA: Diagnosis not present

## 2023-06-07 ENCOUNTER — Other Ambulatory Visit: Payer: Self-pay | Admitting: Internal Medicine

## 2023-06-07 DIAGNOSIS — Z1231 Encounter for screening mammogram for malignant neoplasm of breast: Secondary | ICD-10-CM

## 2023-07-14 ENCOUNTER — Inpatient Hospital Stay (HOSPITAL_COMMUNITY)

## 2023-07-14 ENCOUNTER — Other Ambulatory Visit: Payer: Self-pay

## 2023-07-14 ENCOUNTER — Emergency Department (HOSPITAL_COMMUNITY)

## 2023-07-14 ENCOUNTER — Encounter (HOSPITAL_COMMUNITY): Payer: Self-pay | Admitting: Internal Medicine

## 2023-07-14 ENCOUNTER — Inpatient Hospital Stay (HOSPITAL_COMMUNITY)
Admission: EM | Admit: 2023-07-14 | Discharge: 2023-07-18 | DRG: 042 | Disposition: A | Discharge status: Home / Self Care | Attending: Internal Medicine | Admitting: Internal Medicine

## 2023-07-14 DIAGNOSIS — R29703 NIHSS score 3: Secondary | ICD-10-CM | POA: Diagnosis present

## 2023-07-14 DIAGNOSIS — Z8673 Personal history of transient ischemic attack (TIA), and cerebral infarction without residual deficits: Secondary | ICD-10-CM

## 2023-07-14 DIAGNOSIS — G4733 Obstructive sleep apnea (adult) (pediatric): Secondary | ICD-10-CM | POA: Diagnosis present

## 2023-07-14 DIAGNOSIS — R002 Palpitations: Secondary | ICD-10-CM | POA: Diagnosis present

## 2023-07-14 DIAGNOSIS — Z886 Allergy status to analgesic agent status: Secondary | ICD-10-CM | POA: Diagnosis not present

## 2023-07-14 DIAGNOSIS — R4701 Aphasia: Secondary | ICD-10-CM | POA: Diagnosis present

## 2023-07-14 DIAGNOSIS — E559 Vitamin D deficiency, unspecified: Secondary | ICD-10-CM | POA: Diagnosis present

## 2023-07-14 DIAGNOSIS — E785 Hyperlipidemia, unspecified: Secondary | ICD-10-CM | POA: Diagnosis present

## 2023-07-14 DIAGNOSIS — Z823 Family history of stroke: Secondary | ICD-10-CM

## 2023-07-14 DIAGNOSIS — Z6832 Body mass index (BMI) 32.0-32.9, adult: Secondary | ICD-10-CM | POA: Diagnosis not present

## 2023-07-14 DIAGNOSIS — I6389 Other cerebral infarction: Secondary | ICD-10-CM | POA: Diagnosis not present

## 2023-07-14 DIAGNOSIS — I4891 Unspecified atrial fibrillation: Secondary | ICD-10-CM | POA: Diagnosis present

## 2023-07-14 DIAGNOSIS — R471 Dysarthria and anarthria: Secondary | ICD-10-CM | POA: Diagnosis present

## 2023-07-14 DIAGNOSIS — E538 Deficiency of other specified B group vitamins: Secondary | ICD-10-CM | POA: Diagnosis present

## 2023-07-14 DIAGNOSIS — I639 Cerebral infarction, unspecified: Secondary | ICD-10-CM | POA: Diagnosis not present

## 2023-07-14 DIAGNOSIS — Z90710 Acquired absence of both cervix and uterus: Secondary | ICD-10-CM

## 2023-07-14 DIAGNOSIS — R29810 Facial weakness: Secondary | ICD-10-CM | POA: Diagnosis present

## 2023-07-14 DIAGNOSIS — E66811 Obesity, class 1: Secondary | ICD-10-CM | POA: Diagnosis present

## 2023-07-14 DIAGNOSIS — I720 Aneurysm of carotid artery: Secondary | ICD-10-CM | POA: Diagnosis present

## 2023-07-14 DIAGNOSIS — Z833 Family history of diabetes mellitus: Secondary | ICD-10-CM | POA: Diagnosis not present

## 2023-07-14 DIAGNOSIS — Z8249 Family history of ischemic heart disease and other diseases of the circulatory system: Secondary | ICD-10-CM

## 2023-07-14 DIAGNOSIS — Z79899 Other long term (current) drug therapy: Secondary | ICD-10-CM

## 2023-07-14 DIAGNOSIS — I69898 Other sequelae of other cerebrovascular disease: Secondary | ICD-10-CM | POA: Diagnosis not present

## 2023-07-14 DIAGNOSIS — I10 Essential (primary) hypertension: Secondary | ICD-10-CM | POA: Diagnosis present

## 2023-07-14 DIAGNOSIS — I63411 Cerebral infarction due to embolism of right middle cerebral artery: Principal | ICD-10-CM | POA: Diagnosis present

## 2023-07-14 LAB — DIFFERENTIAL
Abs Immature Granulocytes: 0.01 10*3/uL (ref 0.00–0.07)
Basophils Absolute: 0 10*3/uL (ref 0.0–0.1)
Basophils Relative: 0 %
Eosinophils Absolute: 0.1 10*3/uL (ref 0.0–0.5)
Eosinophils Relative: 2 %
Immature Granulocytes: 0 %
Lymphocytes Relative: 19 %
Lymphs Abs: 1.6 10*3/uL (ref 0.7–4.0)
Monocytes Absolute: 0.8 10*3/uL (ref 0.1–1.0)
Monocytes Relative: 10 %
Neutro Abs: 6 10*3/uL (ref 1.7–7.7)
Neutrophils Relative %: 69 %

## 2023-07-14 LAB — RAPID URINE DRUG SCREEN, HOSP PERFORMED
Amphetamines: NOT DETECTED
Barbiturates: NOT DETECTED
Benzodiazepines: NOT DETECTED
Cocaine: NOT DETECTED
Opiates: NOT DETECTED
Tetrahydrocannabinol: NOT DETECTED

## 2023-07-14 LAB — I-STAT CHEM 8, ED
BUN: 23 mg/dL (ref 8–23)
Calcium, Ion: 1.17 mmol/L (ref 1.15–1.40)
Chloride: 104 mmol/L (ref 98–111)
Creatinine, Ser: 0.9 mg/dL (ref 0.44–1.00)
Glucose, Bld: 109 mg/dL — ABNORMAL HIGH (ref 70–99)
HCT: 39 % (ref 36.0–46.0)
Hemoglobin: 13.3 g/dL (ref 12.0–15.0)
Potassium: 3.8 mmol/L (ref 3.5–5.1)
Sodium: 138 mmol/L (ref 135–145)
TCO2: 20 mmol/L — ABNORMAL LOW (ref 22–32)

## 2023-07-14 LAB — COMPREHENSIVE METABOLIC PANEL WITH GFR
ALT: 19 U/L (ref 0–44)
AST: 19 U/L (ref 15–41)
Albumin: 3.8 g/dL (ref 3.5–5.0)
Alkaline Phosphatase: 89 U/L (ref 38–126)
Anion gap: 12 (ref 5–15)
BUN: 23 mg/dL (ref 8–23)
CO2: 21 mmol/L — ABNORMAL LOW (ref 22–32)
Calcium: 9.5 mg/dL (ref 8.9–10.3)
Chloride: 105 mmol/L (ref 98–111)
Creatinine, Ser: 0.84 mg/dL (ref 0.44–1.00)
GFR, Estimated: >60 mL/min (ref >60)
Glucose, Bld: 108 mg/dL — ABNORMAL HIGH (ref 70–99)
Potassium: 3.8 mmol/L (ref 3.5–5.1)
Sodium: 138 mmol/L (ref 135–145)
Total Bilirubin: 0.8 mg/dL (ref 0.0–1.2)
Total Protein: 6.5 g/dL (ref 6.5–8.1)

## 2023-07-14 LAB — CBC
HCT: 39.5 % (ref 36.0–46.0)
Hemoglobin: 13.5 g/dL (ref 12.0–15.0)
MCH: 30.1 pg (ref 26.0–34.0)
MCHC: 34.2 g/dL (ref 30.0–36.0)
MCV: 88 fL (ref 80.0–100.0)
Platelets: 319 10*3/uL (ref 150–400)
RBC: 4.49 MIL/uL (ref 3.87–5.11)
RDW: 12.9 % (ref 11.5–15.5)
WBC: 8.6 10*3/uL (ref 4.0–10.5)
nRBC: 0 % (ref 0.0–0.2)

## 2023-07-14 LAB — PROTIME-INR
INR: 1 (ref 0.8–1.2)
Prothrombin Time: 13.6 s (ref 11.4–15.2)

## 2023-07-14 LAB — ETHANOL: Alcohol, Ethyl (B): <15 mg/dL (ref <15)

## 2023-07-14 LAB — APTT: aPTT: 31 s (ref 24–36)

## 2023-07-14 MED ORDER — SENNOSIDES-DOCUSATE SODIUM 8.6-50 MG PO TABS: 1.0000 | ORAL_TABLET | Freq: Every evening | ORAL | Status: DC | PRN

## 2023-07-14 MED ORDER — ENOXAPARIN SODIUM 40 MG/0.4ML IJ SOSY
40.0000 mg | PREFILLED_SYRINGE | INTRAMUSCULAR | Status: DC
  Administered 2023-07-14 – 2023-07-17 (×4): 40 mg via SUBCUTANEOUS
  Filled 2023-07-14 (×4): qty 0.4

## 2023-07-14 MED ORDER — IOHEXOL 350 MG/ML SOLN
75.0000 mL | Freq: Once | INTRAVENOUS | Status: AC | PRN
  Administered 2023-07-14: 75 mL via INTRAVENOUS

## 2023-07-14 MED ORDER — HYDRALAZINE HCL 20 MG/ML IJ SOLN: 10.0000 mg | Freq: Four times a day (QID) | INTRAMUSCULAR | Status: DC | PRN

## 2023-07-14 MED ORDER — ASPIRIN 81 MG PO TBEC: 81.0000 mg | DELAYED_RELEASE_TABLET | Freq: Every day | ORAL | Status: DC

## 2023-07-14 MED ORDER — ASPIRIN 325 MG PO TABS: 325.0000 mg | ORAL_TABLET | Freq: Once | ORAL | Status: DC

## 2023-07-14 MED ORDER — CLOPIDOGREL BISULFATE 300 MG PO TABS
300.0000 mg | ORAL_TABLET | Freq: Once | ORAL | Status: AC
  Administered 2023-07-14: 300 mg via ORAL
  Filled 2023-07-14: qty 1

## 2023-07-14 MED ORDER — ACETAMINOPHEN 650 MG RE SUPP: 650.0000 mg | RECTAL | Status: DC | PRN

## 2023-07-14 MED ORDER — ASPIRIN 81 MG PO TBEC
81.0000 mg | DELAYED_RELEASE_TABLET | Freq: Every day | ORAL | Status: DC
  Administered 2023-07-14 – 2023-07-18 (×5): 81 mg via ORAL
  Filled 2023-07-14 (×5): qty 1

## 2023-07-14 MED ORDER — ACETAMINOPHEN 160 MG/5ML PO SOLN: 650.0000 mg | ORAL | Status: DC | PRN

## 2023-07-14 MED ORDER — ATORVASTATIN CALCIUM 10 MG PO TABS: 20.0000 mg | ORAL_TABLET | Freq: Every day | ORAL | Status: DC

## 2023-07-14 MED ORDER — CLOPIDOGREL BISULFATE 75 MG PO TABS
75.0000 mg | ORAL_TABLET | Freq: Every day | ORAL | Status: DC
  Administered 2023-07-15 – 2023-07-18 (×4): 75 mg via ORAL
  Filled 2023-07-14 (×4): qty 1

## 2023-07-14 MED ORDER — PANTOPRAZOLE SODIUM 40 MG PO TBEC
40.0000 mg | DELAYED_RELEASE_TABLET | Freq: Every day | ORAL | Status: DC
  Administered 2023-07-14 – 2023-07-18 (×5): 40 mg via ORAL
  Filled 2023-07-14 (×5): qty 1

## 2023-07-14 MED ORDER — STROKE: EARLY STAGES OF RECOVERY BOOK
Freq: Once | Status: AC
  Filled 2023-07-14: qty 1

## 2023-07-14 MED ORDER — ASPIRIN 325 MG PO TABS: 325.0000 mg | ORAL_TABLET | Freq: Every day | ORAL | Status: DC

## 2023-07-14 MED ORDER — SODIUM CHLORIDE 0.9% FLUSH: 3.0000 mL | INTRAVENOUS | Status: DC | PRN

## 2023-07-14 MED ORDER — DIPHENHYDRAMINE HCL 50 MG/ML IJ SOLN: 50.0000 mg | Freq: Four times a day (QID) | INTRAMUSCULAR | Status: DC | PRN

## 2023-07-14 MED ORDER — SODIUM CHLORIDE 0.9% FLUSH
3.0000 mL | Freq: Two times a day (BID) | INTRAVENOUS | Status: DC
  Administered 2023-07-14 – 2023-07-15 (×2): 3 mL via INTRAVENOUS
  Administered 2023-07-15: 10 mL via INTRAVENOUS
  Administered 2023-07-16: 3 mL via INTRAVENOUS
  Administered 2023-07-16: 10 mL via INTRAVENOUS
  Administered 2023-07-17 – 2023-07-18 (×2): 3 mL via INTRAVENOUS

## 2023-07-14 MED ORDER — ACETAMINOPHEN 325 MG PO TABS
650.0000 mg | ORAL_TABLET | ORAL | Status: DC | PRN
  Administered 2023-07-14 – 2023-07-18 (×6): 650 mg via ORAL
  Filled 2023-07-14 (×6): qty 2

## 2023-07-14 MED ORDER — CLOPIDOGREL BISULFATE 75 MG PO TABS: 75.0000 mg | ORAL_TABLET | Freq: Every day | ORAL | Status: DC

## 2023-07-14 NOTE — ED Notes (Signed)
 This nurse called [facility] to have patient monitored.

## 2023-07-14 NOTE — Plan of Care (Addendum)
 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 Focal fusiform dilatation of the distal right ICA just proximal to the skull base with a maximum diameter of 10 mm.  Discussed this finding with on-call vascular surgery Dr. Susi Eric who stated that given the dissection flap is higher in the base of the skull need to consult intervention neurology for further recommendation also recommended good blood pressure control.  Spoke with on-call interventional neurology Dr.Deveshwar over phone who recommended to continue with antiplatelet therapy and interventional neurology will do diagnostic arteriogram  on Monday.

## 2023-07-14 NOTE — ED Triage Notes (Signed)
 EMS called by pt daughter for pt having slurred speech over the phone. Per EMS LSW was 1230. When EMS arrives pt has right facial droop , confusion, and unable to follow directions.

## 2023-07-14 NOTE — ED Provider Notes (Signed)
 [place] EMERGENCY DEPARTMENT AT [facility] Provider Note   CSN: [id number] Arrival date & time: 07/14/23  1819  An emergency department physician performed an initial assessment on this suspected stroke patient at 1818.  History  Chief Complaint  Patient presents with  . Code Stroke    Kathryn Cameron is a 68 y.o. female.  Patient is a 68 year old female with PMH HTN presenting to the ED for Code stroke. Patient reports around 12-1PM today she called a family member and was having word finding difficulty. She also reports numbness to her left side. Denies any headache or vision changes. States she was otherwise feeling well. Did report several years ago had an episode of monocular vision loss and was told to take baby aspirin but never had formal work up. EMS did report some mild confusion on their arrival where the patient was easily distractable and difficulty with focus.  The history is provided by the patient and the EMS personnel.       Home Medications Prior to Admission medications   Medication Sig Start Date End Date Taking? Authorizing Provider  amLODipine  (NORVASC ) 5 MG tablet Take 1 tablet (5 mg total) by mouth daily. Patient not taking: Reported on 06/26/2019 06/09/19 07/09/19  Schulz, Zachary R, PA-C  HYDROcodone -acetaminophen  (NORCO) 5-325 MG tablet Take 2 tablets by mouth every 6 (six) hours as needed for moderate pain. Patient not taking: Reported on 08/14/2019 06/23/19   Flynn Hylan, MD  lisinopril-hydrochlorothiazide (ZESTORETIC) 10-12.5 MG tablet Take 1 tablet by mouth daily. 07/31/19   [provider]      Allergies    Naproxen    Review of Systems   Review of Systems  Physical Exam Updated Vital Signs BP (!) 168/83   Pulse 81   Temp 98.7 F (37.1 C)   Resp 19   Ht 5\' 3"  (1.6 m)   Wt 83.8 kg   SpO2 97%   BMI 32.73 kg/m  Physical Exam Vitals and nursing note reviewed.  Constitutional:      General: She is not in acute  distress.    Appearance: Normal appearance.  HENT:     Head: Normocephalic.     Nose: Nose normal.     Mouth/Throat:     Mouth: Mucous membranes are moist.     Pharynx: Oropharynx is clear.  Eyes:     Extraocular Movements: Extraocular movements intact.     Conjunctiva/sclera: Conjunctivae normal.     Pupils: Pupils are equal, round, and reactive to light.  Cardiovascular:     Rate and Rhythm: Normal rate and regular rhythm.     Heart sounds: Normal heart sounds.  Pulmonary:     Effort: Pulmonary effort is normal.     Breath sounds: Normal breath sounds.  Abdominal:     General: Abdomen is flat.     Palpations: Abdomen is soft.     Tenderness: There is no abdominal tenderness.  Musculoskeletal:        General: Normal range of motion.     Cervical back: Normal range of motion.  Skin:    General: Skin is warm and dry.  Neurological:     Mental Status: She is alert and oriented to person, place, and time.     Comments: Mild L-sided facial droop Decreased grip strength in L hand No drift in all 4 extremities Normal finger to nose bilaterally Occasional word finding difficulty Decreased sensation in L face, arm and leg  Psychiatric:  Mood and Affect: Mood normal.        Behavior: Behavior normal.     ED Results / Procedures / Treatments   Labs (all labs ordered are listed, but only abnormal results are displayed) Labs Reviewed  COMPREHENSIVE METABOLIC PANEL WITH GFR - Abnormal; Notable for the following components:      Result Value   CO2 21 (*)    Glucose, Bld 108 (*)    All other components within normal limits  I-STAT CHEM 8, ED - Abnormal; Notable for the following components:   Glucose, Bld 109 (*)    TCO2 20 (*)    All other components within normal limits  ETHANOL  PROTIME-INR  APTT  CBC  DIFFERENTIAL  RAPID URINE DRUG SCREEN, HOSP PERFORMED    EKG None  Radiology CT HEAD CODE STROKE WO CONTRAST Result Date: 07/14/2023 CLINICAL DATA:  Code  stroke. Neuro deficit, concern for stroke, right-sided facial droop, confusion, slurred speech. EXAM: CT HEAD WITHOUT CONTRAST TECHNIQUE: Contiguous axial images were obtained from the base of the skull through the vertex without intravenous contrast. RADIATION DOSE REDUCTION: This exam was performed according to the departmental dose-optimization program which includes automated exposure control, adjustment of the mA and/or kV according to patient size and/or use of iterative reconstruction technique. COMPARISON:  None Available. FINDINGS: Brain: No acute intracranial hemorrhage. Hypoattenuation and loss of gray-white differentiation in the posterior aspect of the right cerebral hemisphere involving the posterior right temporal lobe, lateral right occipital lobe and a portion of the right inferior parietal lobule. Nonspecific hypoattenuation in the periventricular and subcortical white matter favored to reflect chronic microvascular ischemic changes. No significant mass effect or midline shift. The basilar cisterns are patent. Posterior fossa is unremarkable. Ventricles: The ventricles are normal. Vascular: No hyperdense vessel or unexpected calcification. Skull: No acute or aggressive finding. Orbits: Orbits are symmetric. Sinuses: The visualized paranasal sinuses are clear. Other: Mastoid air cells are clear. ASPECTS [facility] Stroke Program Early CT Score) - Ganglionic level infarction (caudate, lentiform nuclei, internal capsule, insula, M1-M3 cortex): 6 - Supraganglionic infarction (M4-M6 cortex): 3 Total score (0-10 with 10 being normal): 9 IMPRESSION: 1. Focal hypoattenuation and loss of gray-white differentiation in the posterior aspect of the right MCA territory concerning for acute infarct. 2. No acute intracranial hemorrhage. 3. Mild chronic microvascular ischemic changes. 4. ASPECTS is 9 These results were communicated to Dr. Doretta Gant At 6:47 pm on 07/14/2023 by text page via the [facility] messaging system.  Electronically Signed   By: Denny Flack M.D.   On: 07/14/2023 18:47    Procedures Procedures    Medications Ordered in ED Medications - No data to display  ED Course/ Medical Decision Making/ A&P Clinical Course as of 07/14/23 1938  Sat Jul 14, 2023  0454 I spoke with Dr. Doretta Gant with neurology who states patient is outside the window for TNK and not a thrombectomy candidate. Recommends admission to hospitalist for stroke work up. [VK]  1937 Labs within normal range. Will be admitted for stroke work up. [VK]    Clinical Course User Index [VK] Kingsley, Gwenetta Devos K, DO                                 Medical Decision Making This patient presents to the ED with chief complaint(s) of aphasia, numbness with pertinent past medical history of HTN which further complicates the presenting complaint. The complaint involves an extensive differential diagnosis  and also carries with it a high risk of complications and morbidity.    The differential diagnosis includes CVA, TIA, ICH, mass effect, electrolyte derangement, hypo or hyperglycemia  Additional history obtained: Additional history obtained from EMS  Records reviewed Care Everywhere/External Records  ED Course and Reassessment: Patient was made a prehospital arrival code stroke and was immediately met by neurology at the door and myself in CT scanner.  Airway was intact.  Patient was having some word finding difficulty, concentration difficulty and mild left-sided facial droop with decreased sensation on the left and decreased grip strength on the left.  Patient was quickly transported to CT scanner.  IV access was obtained was blood pressure stable on arrival.  Independent labs interpretation:  The following labs were independently interpreted: Within normal range  Independent visualization of imaging: - I independently visualized the following imaging with scope of interpretation limited to determining acute life threatening  conditions related to emergency care: CT head, which revealed no acute disease  Consultation: - Consulted or discussed management/test interpretation w/ external professional: Neurology, hospitalist  Consideration for admission or further workup: Patient requires admission for acute stroke Social Determinants of health: N/A    Amount and/or Complexity of Data Reviewed Labs: ordered. Radiology: ordered.          Final Clinical Impression(s) / ED Diagnoses Final diagnoses:  None    Rx / DC Orders ED Discharge Orders     None         Kingsley, Chiana Wamser K, DO 07/14/23 1937

## 2023-07-14 NOTE — ED Notes (Signed)
 Patient returned from CT

## 2023-07-14 NOTE — H&P (Addendum)
 History and Physical    Kathryn Cameron ZOX:[id number] DOB: 1955-02-15 DOA: 07/14/2023  PCP: Lenell Query, PA-C   Patient coming from: Home   Chief Complaint:  Chief Complaint  Patient presents with   Code Stroke   ED TRIAGE note: EMS called by pt daughter for pt having slurred speech over the phone. Per EMS LSW was 1230. When EMS arrives pt has right facial droop , confusion, and unable to follow directions.            HPI:  Kathryn Cameron is a 68 y.o. female with medical history significant of history of previous hernia repair, essential hypertension, obstructive sleep apnea, vitamin B12 deficiency, and vitamin D deficiency presented emergency department accompanied by patient's daughter with complaining of slurred speech that patient's daughter notified over phone.  Last known well 12:30pm 07/14/2023.  At presentation to ED patient found to have confusion, left-sided facial droop and word finding difficulty.  Patient also has reported numbness of the left side.  Denies any headache, blurry vision.  Reported otherwise she feeling well.  Patient did reported that she had episode of monocular vision loss many years ago and was told to take baby aspirin however never had a formal workup during that time.  To my evaluation at bedside patient noticed to have dysarthria and having word finding difficulty.  When asking questions patient had few pauses to think and formulation of a sentence however able to answer all the questions appropriately.  She is still complaining about left-sided facial numbness and left-sided upper extremity numbness.  Denies any blurry vision, double vision, weakness, headache, tremor, tingling, presyncope and syncopal episode.  Patient denies any chest pain, shortness of breath and lower extremity swelling.   ED Course:  At presentation to ED patient found to have elevated blood pressure 186/73 otherwise hemodynamically stable. CMP unremarkable except low  bicarb 21.  CBC unremarkable.  Normal pro time INR.  Pending UDS. EKG showing normal sinus rhythm heart rate 86.  There is no ST-T wave abnormality.  Normal QTc.  CT head obtained in the ED which showed: 1. Focal hypoattenuation and loss of gray-white differentiation in the posterior aspect of the right MCA territory concerning for acute infarct. 2. No acute intracranial hemorrhage. 3. Mild chronic microvascular ischemic changes. 4. ASPECTS is 9  With the concern for acute ischemic CVA ED physician consulted neurology Dr. Doretta Gant who recommended admit under hospitalist service for further stroke workup and patient is out of window for TNK per neurology.  Giving patient aspirin 325 mg and Plavix 300 mg.  Hospitalist has been consulted for further evaluation management of acute ischemic CVA.  Significant labs in the ED: Lab Orders         Ethanol         Protime-INR         APTT         CBC         Differential         Comprehensive metabolic panel         Urine rapid drug screen (hosp performed)         HIV Antibody (routine testing w rflx)         Lipid panel         Hemoglobin A1c         CBC         Comprehensive metabolic panel         I-stat chem 8, ed  Review of Systems:  Review of Systems  Constitutional:  Negative for chills, fever and malaise/fatigue.  Eyes:  Negative for blurred vision and double vision.  Cardiovascular:  Negative for chest pain and palpitations.  Gastrointestinal:  Negative for heartburn.  Musculoskeletal:  Negative for falls.  Neurological:  Positive for sensory change and speech change. Negative for dizziness, tingling, tremors, focal weakness, seizures, loss of consciousness, weakness and headaches.  Endo/Heme/Allergies:  Negative for environmental allergies. Does not bruise/bleed easily.  Psychiatric/Behavioral:  The patient is not nervous/anxious.     Past Medical History:  Diagnosis Date   Chicken pox    Hypertension     Past  Surgical History:  Procedure Laterality Date   LAPAROSCOPIC HYSTERECTOMY     left one ovary in place   XI ROBOTIC ASSISTED INGUINAL HERNIA REPAIR WITH MESH Bilateral 06/23/2019   Procedure: XI ROBOTIC ASSISTED Bilateral Incarcerated INGUINAL HERNIA REPAIR WITH MESH;  Surgeon: Flynn Hylan, MD;  Location: [facility] ORS;  Service: General;  Laterality: Bilateral;     reports that she has never smoked. She has never used smokeless tobacco. She reports that she does not drink alcohol and does not use drugs.  Allergies  Allergen Reactions   Naproxen Hives    Family History  Problem Relation Age of Onset   Stroke Father        in his 38's   Hypertension Other        parent   Diabetes Other        parent    Prior to Admission medications   Medication Sig Start Date End Date Taking? Authorizing Provider  amLODipine  (NORVASC ) 5 MG tablet Take 1 tablet (5 mg total) by mouth daily. Patient not taking: Reported on 06/26/2019 06/09/19 07/09/19  Schulz, Zachary R, PA-C  HYDROcodone -acetaminophen  (NORCO) 5-325 MG tablet Take 2 tablets by mouth every 6 (six) hours as needed for moderate pain. Patient not taking: Reported on 08/14/2019 06/23/19   Flynn Hylan, MD  lisinopril-hydrochlorothiazide (ZESTORETIC) 10-12.5 MG tablet Take 1 tablet by mouth daily. 07/31/19   [provider]     Physical Exam: Vitals:   07/14/23 1845 07/14/23 1846 07/14/23 1857 07/14/23 1900  BP: (!) 156/89  (!) 168/88 (!) 168/83  Pulse: 86  90 81  Resp:   18 19  Temp: 97.9 F (36.6 C)  98.7 F (37.1 C)   SpO2: 97%  98% 97%  Weight:  83.8 kg    Height:  5\' 3"  (1.6 m)      Physical Exam Vitals and nursing note reviewed.  Constitutional:      Appearance: She is obese. She is not ill-appearing.  HENT:     Mouth/Throat:     Mouth: Mucous membranes are moist.  Eyes:     Pupils: Pupils are equal, round, and reactive to light.  Cardiovascular:     Rate and Rhythm: Normal rate and regular rhythm.      Pulses: Normal pulses.     Heart sounds: Normal heart sounds.  Pulmonary:     Effort: Pulmonary effort is normal.     Breath sounds: Normal breath sounds.  Abdominal:     General: Bowel sounds are normal.  Musculoskeletal:     Cervical back: Neck supple.     Right lower leg: No edema.     Left lower leg: No edema.  Skin:    Capillary Refill: Capillary refill takes less than 2 seconds.  Neurological:     Mental Status: She is alert and  oriented to person, place, and time.     Cranial Nerves: No cranial nerve deficit.     Sensory: Sensory deficit present.     Motor: No weakness.     Coordination: Coordination normal.     Gait: Gait normal.     Deep Tendon Reflexes: Reflexes abnormal.  Psychiatric:        Mood and Affect: Mood normal.        Behavior: Behavior normal.        Thought Content: Thought content normal.        Judgment: Judgment normal.      Labs on Admission: I have personally reviewed following labs and imaging studies  CBC: Recent Labs  Lab 07/14/23 1821 07/14/23 1852  WBC  --  8.6  NEUTROABS  --  6.0  HGB 13.3 13.5  HCT 39.0 39.5  MCV  --  88.0  PLT  --  319   Basic Metabolic Panel: Recent Labs  Lab 07/14/23 1821 07/14/23 1852  NA 138 138  K 3.8 3.8  CL 104 105  CO2  --  21*  GLUCOSE 109* 108*  BUN 23 23  CREATININE 0.90 0.84  CALCIUM  --  9.5   GFR: Estimated Creatinine Clearance: 65.8 mL/min (by C-G formula based on SCr of 0.84 mg/dL). Liver Function Tests: Recent Labs  Lab 07/14/23 1852  AST 19  ALT 19  ALKPHOS 89  BILITOT 0.8  PROT 6.5  ALBUMIN 3.8   No results for input(s): "LIPASE", "AMYLASE" in the last 168 hours. No results for input(s): "AMMONIA" in the last 168 hours. Coagulation Profile: Recent Labs  Lab 07/14/23 1852  INR 1.0   Cardiac Enzymes: No results for input(s): "CKTOTAL", "CKMB", "CKMBINDEX", "TROPONINI", "TROPONINIHS" in the last 168 hours. BNP (last 3 results) No results for input(s): "BNP" in the last  8760 hours. HbA1C: No results for input(s): "HGBA1C" in the last 72 hours. CBG: No results for input(s): "GLUCAP" in the last 168 hours. Lipid Profile: No results for input(s): "CHOL", "HDL", "LDLCALC", "TRIG", "CHOLHDL", "LDLDIRECT" in the last 72 hours. Thyroid Function Tests: No results for input(s): "TSH", "T4TOTAL", "FREET4", "T3FREE", "THYROIDAB" in the last 72 hours. Anemia Panel: No results for input(s): "VITAMINB12", "FOLATE", "FERRITIN", "TIBC", "IRON", "RETICCTPCT" in the last 72 hours. Urine analysis:    Component Value Date/Time   COLORURINE YELLOW 06/09/2019 0210   APPEARANCEUR CLEAR 06/09/2019 0210   LABSPEC 1.015 06/09/2019 0210   PHURINE 5.5 06/09/2019 0210   GLUCOSEU NEGATIVE 06/09/2019 0210   HGBUR NEGATIVE 06/09/2019 0210   BILIRUBINUR NEGATIVE 06/09/2019 0210   KETONESUR NEGATIVE 06/09/2019 0210   PROTEINUR NEGATIVE 06/09/2019 0210   NITRITE NEGATIVE 06/09/2019 0210   LEUKOCYTESUR SMALL (A) 06/09/2019 0210    Radiological Exams on Admission: I have personally reviewed images CT ANGIO HEAD NECK W WO CM Result Date: 07/14/2023 CLINICAL DATA:  Acute ischemic attack EXAM: CT ANGIOGRAPHY HEAD AND NECK WITH AND WITHOUT CONTRAST TECHNIQUE: Multidetector CT imaging of the head and neck was performed using the standard protocol during bolus administration of intravenous contrast. Multiplanar CT image reconstructions and MIPs were obtained to evaluate the vascular anatomy. Carotid stenosis measurements (when applicable) are obtained utilizing NASCET criteria, using the distal internal carotid diameter as the denominator. RADIATION DOSE REDUCTION: This exam was performed according to the departmental dose-optimization program which includes automated exposure control, adjustment of the mA and/or kV according to patient size and/or use of iterative reconstruction technique. CONTRAST:  75mL OMNIPAQUE  IOHEXOL  350 MG/ML SOLN  COMPARISON:  None Available. FINDINGS: CTA NECK FINDINGS  Skeleton: No acute abnormality or high grade bony spinal canal stenosis. Other neck: Normal pharynx, larynx and major salivary glands. No cervical lymphadenopathy. Unremarkable thyroid gland. Upper chest: No pneumothorax or pleural effusion. No nodules or masses. Aortic arch: There is no calcific atherosclerosis of the aortic arch. Conventional 3 vessel aortic branching pattern. RIGHT carotid system: The common carotid artery and carotid bifurcation are normal. There is focal fusiform dilatation of the distal ICA just proximal to the skull base with a maximum diameter of 10 mm. LEFT carotid system: The common carotid artery and carotid bifurcation are normal. The distal ICA is extremely tortuous with dilatation measuring up to 11 mm. There is a fenestration or dissection flap (5:143 and 8:121). Vertebral arteries: Right dominant configuration. Minimal atherosclerosis of both vertebral arteries are widely patent. CTA HEAD FINDINGS POSTERIOR CIRCULATION: Normal vertebral artery V4 segments. No proximal occlusion of the anterior or inferior cerebellar arteries. Basilar artery is normal. Superior cerebellar arteries are normal. Posterior cerebral arteries are normal. ANTERIOR CIRCULATION: Intracranial internal carotid arteries are normal. Anterior cerebral arteries are normal. Middle cerebral arteries are normal. Venous sinuses: As permitted by contrast timing, patent. Anatomic variants: Patent right P-comm Review of the MIP images confirms the above findings. IMPRESSION: 1. No emergent large vessel occlusion or high-grade stenosis of the intracranial arteries. 2. Fusiform dilatation of the distal left ICA with a fenestration or dissection flap. 3. Focal fusiform dilatation of the distal right ICA just proximal to the skull base with a maximum diameter of 10 mm. 4. No hemodynamically significant stenosis of the cervical carotid or vertebral arteries. Electronically Signed   By: Juanetta Nordmann M.D.   On: 07/14/2023 20:40    CT HEAD CODE STROKE WO CONTRAST Result Date: 07/14/2023 CLINICAL DATA:  Code stroke. Neuro deficit, concern for stroke, right-sided facial droop, confusion, slurred speech. EXAM: CT HEAD WITHOUT CONTRAST TECHNIQUE: Contiguous axial images were obtained from the base of the skull through the vertex without intravenous contrast. RADIATION DOSE REDUCTION: This exam was performed according to the departmental dose-optimization program which includes automated exposure control, adjustment of the mA and/or kV according to patient size and/or use of iterative reconstruction technique. COMPARISON:  None Available. FINDINGS: Brain: No acute intracranial hemorrhage. Hypoattenuation and loss of gray-white differentiation in the posterior aspect of the right cerebral hemisphere involving the posterior right temporal lobe, lateral right occipital lobe and a portion of the right inferior parietal lobule. Nonspecific hypoattenuation in the periventricular and subcortical white matter favored to reflect chronic microvascular ischemic changes. No significant mass effect or midline shift. The basilar cisterns are patent. Posterior fossa is unremarkable. Ventricles: The ventricles are normal. Vascular: No hyperdense vessel or unexpected calcification. Skull: No acute or aggressive finding. Orbits: Orbits are symmetric. Sinuses: The visualized paranasal sinuses are clear. Other: Mastoid air cells are clear. ASPECTS [facility] Stroke Program Early CT Score) - Ganglionic level infarction (caudate, lentiform nuclei, internal capsule, insula, M1-M3 cortex): 6 - Supraganglionic infarction (M4-M6 cortex): 3 Total score (0-10 with 10 being normal): 9 IMPRESSION: 1. Focal hypoattenuation and loss of gray-white differentiation in the posterior aspect of the right MCA territory concerning for acute infarct. 2. No acute intracranial hemorrhage. 3. Mild chronic microvascular ischemic changes. 4. ASPECTS is 9 These results were communicated to  Dr. Doretta Gant At 6:47 pm on 07/14/2023 by text page via the [facility] messaging system. Electronically Signed   By: Denny Flack M.D.   On: 07/14/2023 18:47  EKG: My personal interpretation of EKG shows:  EKG showing normal sinus rhythm heart rate 86.  There is no ST-T wave abnormality.  Normal QTc.   Assessment/Plan: Principal Problem:   Acute ischemic cerebrovascular accident (CVA) (HCC) Active Problems:   Essential hypertension   Hyperlipidemia   Obstructive sleep apnea   Acute stroke due to ischemia [facility])   Neurologic deficit due to acute ischemic cerebrovascular accident (CVA) (HCC)    Assessment and Plan: Acute ischemic CVA Dysarthria Left-sided facial numbness and left-sided upper extremity numbness - Patient presented to emergency department with complaining of speech difficulty, word finding difficulty, left-sided facial droop, left-sided facial numbness and left-sided upper extremity numbness.  At presentation to ED patient found to be hypertensive.  Last known well 1 PM 07/14/2023 - CBC and CMP grossly unremarkable. - EKG showing normal sinus rhythm heart rate 86.  No history of abnormality normal QTc - CT head showed evidence of acute ischemic CVA. -ED physician consulted spoke with on-call neurology Dr. Doretta Gant who recommended admit for stroke workup.  Per neurology out of window for [place] K. -NIH stroke scale is 3 points. - In the setting of acute ischemic CVA giving aspirin 325 mg and Plavix 300 mg tonight followed by will continue aspirin 81 and Plavix 75 mg daily. Of note, patient reported she was told by her mother that history of hives with naproxen.  Spoke with patient daughter over phone who is unaware of any allergic reaction to naproxen in the past and agrees to treat with aspirin for the management of CVA. - Obtaining CTA head and neck, MRI of the brain and echocardiogram per stroke protocol - Checking lipid panel A1c. - Continue permissive hypertension over the course of  next 24-hour.  Goal blood pressure below 210/120.  Continue hydralazine  as needed. -Continue neurocheck every 4 hours. - Will do stroke swallow screen if patient fast can resume diet. - Consulted PT, OT and SLP for evaluation - Continue cardiac monitoring for development of any arrhythmia. -Informed on-call neurology Dr.Khaliqd who will evaluate patient. Update, - Neurology recommended give aspirin and Plavix for 21 days followed by continue aspirin infinitely.  While in the ED waiting for neurology recommendation I have already gave Plavix 300 mg.  Will give aspirin 81 mg and follow neurology recommendation. Addendum -CT angio neck showed Fusiform dilatation of the distal left ICA with a fenestration or dissection flap. Focal fusiform dilatation of the distal right ICA just proximal to the skull base with a maximum diameter of 10 mm.   Discussed this finding with on-call vascular surgery Dr. Susi Eric who stated that given the dissection flap is higher in the base of the skull need to consult intervention neurology for further recommendation also recommended good blood pressure control.   Spoke with on-call interventional neurology Dr.Deveshwar over phone who recommended to continue with antiplatelet therapy and interventional neurology will obtain diagnostic arteriogram on Monday.  Essential hypertension - At home patient is on amlodipine , lisinopril and hydrochlorothiazide. -Holding blood pressure regimen in order to allow permissive hypertension in the setting of ischemic CVA.  Vitamin B12 deficiency Vitamin D deficiency - Can resume supplements on discharge.   DVT prophylaxis:  Lovenox  and SCD. Code Status:  Full Code Diet: Heart healthy and modified diet Family Communication: Updated patient's daughter April King over phone. Disposition Plan: Pending further stroke workup. Consults: Neurology Admission status:   Inpatient, Step Down Unit  Severity of Illness: The appropriate  patient status for this patient is INPATIENT. Inpatient  status is judged to be reasonable and necessary in order to provide the required intensity of service to ensure the patient's safety. The patient's presenting symptoms, physical exam findings, and initial radiographic and laboratory data in the context of their chronic comorbidities is felt to place them at high risk for further clinical deterioration. Furthermore, it is not anticipated that the patient will be medically stable for discharge from the hospital within 2 midnights of admission.   * I certify that at the point of admission it is my clinical judgment that the patient will require inpatient hospital care spanning beyond 2 midnights from the point of admission due to high intensity of service, high risk for further deterioration and high frequency of surveillance required.Aaron Aas    Nigel Wessman, MD [facility]  How to contact the [facility] Attending or Consulting provider 7A - 7P or covering provider during after hours 7P -7A, for this patient.  Check the care team in [facility] and look for a) attending/consulting [facility] provider listed and b) the [facility] team listed Log into [email or web address] and use [facility]'s universal password to access. If you do not have the password, please contact the hospital operator. Locate the [facility] provider you are looking for under [facility] and page to a number that you can be directly reached. If you still have difficulty reaching the provider, please page the [facility] (Director on Call) for the Hospitalists listed on amion for assistance.  07/14/2023, 9:39 PM

## 2023-07-14 NOTE — Consult Note (Signed)
 NEUROLOGY CONSULT NOTE   Date of service: July 14, 2023 Patient Name: Kathryn Cameron MRN:  [id number] DOB:  1955-10-09 Chief Complaint: stroke code Requesting Provider: Sundil, Subrina, MD  History of Present Illness   Kathryn Cameron is a 68 y.o. female with hx of hypertension who presented with speech difficulty.  She spoke to her daughter over the phone who noticed that she was having trouble slurring her words and expressing herself and called EMS.  Patient is confused and unable to provide clear history but states that her speech change occurred at least 4 to 5 hours before she arrived.  She was therefore outside of the TNK window.  NIH stroke scale was 3.  CT head showed no acute process.  Neurologic exam not consistent with LVO therefore CTA was not performed as part of the stroke process.  LKW: 5+ hrs ago, unclear but outside of TNK window Modified rankin score: 1-No significant post stroke disability and can perform usual duties with stroke symptoms IV Thrombolysis: no outside window  NIHSS components Score: Comment  1a Level of Conscious 0[]  1[]  2[]  3[]      1b LOC Questions 0[]  1[x]  2[]       1c LOC Commands 0[]  1[]  2[]       2 Best Gaze 0[]  1[]  2[]       3 Visual 0[]  1[]  2[]  3[]      4 Facial Palsy 0[]  1[x]  2[]  3[]      5a Motor Arm - left 0[]  1[]  2[]  3[]  4[]  UN[]    5b Motor Arm - Right 0[]  1[]  2[]  3[]  4[]  UN[]    6a Motor Leg - Left 0[]  1[]  2[]  3[]  4[]  UN[]    6b Motor Leg - Right 0[]  1[]  2[]  3[]  4[]  UN[]    7 Limb Ataxia 0[]  1[]  2[]  3[]  UN[]     8 Sensory 0[]  1[]  2[]  UN[]      9 Best Language 0[]  1[]  2[]  3[]      10 Dysarthria 0[]  1[x]  2[]  UN[]      11 Extinct. and Inattention 0[]  1[]  2[]       TOTAL:  3   Any unchecked boxes represent a score of 0   ROS  \ Unable to ascertain due to AMS  Past History   Past Medical History:  Diagnosis Date   Chicken pox    Hypertension     Past Surgical History:  Procedure Laterality Date   LAPAROSCOPIC HYSTERECTOMY     left one ovary  in place   XI ROBOTIC ASSISTED INGUINAL HERNIA REPAIR WITH MESH Bilateral 06/23/2019   Procedure: XI ROBOTIC ASSISTED Bilateral Incarcerated INGUINAL HERNIA REPAIR WITH MESH;  Surgeon: Flynn Hylan, MD;  Location: [facility] ORS;  Service: General;  Laterality: Bilateral;    Family History: Family History  Problem Relation Age of Onset   Stroke Father        in his 38's   Hypertension Other        parent   Diabetes Other        parent    Social History  reports that she has never smoked. She has never used smokeless tobacco. She reports that she does not drink alcohol and does not use drugs.  Allergies  Allergen Reactions   Naproxen Hives    Medications   Current Facility-Administered Medications:    [START ON 07/15/2023]  stroke: early stages of recovery book, , Does not apply, Once, Sundil, Subrina, MD   acetaminophen  (TYLENOL ) tablet 650 mg, 650 mg, Oral, Q4H PRN, 650 mg at  28-Jul-2023 2037 **OR** acetaminophen  (TYLENOL ) 160 MG/5ML solution 650 mg, 650 mg, Per Tube, Q4H PRN **OR** acetaminophen  (TYLENOL ) suppository 650 mg, 650 mg, Rectal, Q4H PRN, Sundil, Subrina, MD   [START ON 07/15/2023] aspirin EC tablet 81 mg, 81 mg, Oral, Daily, Sundil, Subrina, MD   aspirin tablet 325 mg, 325 mg, Oral, Once, Sundil, Subrina, MD   [name] ON 07/15/2023] clopidogrel (PLAVIX) tablet 75 mg, 75 mg, Oral, Daily, Sundil, Subrina, MD   diphenhydrAMINE (BENADRYL) injection 50 mg, 50 mg, Intravenous, Q6H PRN, Sundil, Subrina, MD   enoxaparin  (LOVENOX ) injection 40 mg, 40 mg, Subcutaneous, Q24H, Sundil, Subrina, MD, 40 mg at 07/28/2023 2032   hydrALAZINE  (APRESOLINE ) injection 10 mg, 10 mg, Intravenous, Q6H PRN, Sundil, Subrina, MD   pantoprazole  (PROTONIX ) EC tablet 40 mg, 40 mg, Oral, Daily, Sundil, Subrina, MD, 40 mg at 07-28-2023 2031   senna-docusate (Senokot-S) tablet 1 tablet, 1 tablet, Oral, QHS PRN, Sundil, Subrina, MD   sodium chloride flush (NS) 0.9 % injection 3-10 mL, 3-10 mL, Intravenous, Q12H,  Sundil, Subrina, MD, 3 mL at Jul 28, 2023 2236   sodium chloride flush (NS) 0.9 % injection 3-10 mL, 3-10 mL, Intravenous, PRN, Sundil, Subrina, MD  Current Outpatient Medications:    amLODipine  (NORVASC ) 2.5 MG tablet, Take 2.5 mg by mouth daily., Disp: , Rfl:    lisinopril-hydrochlorothiazide (ZESTORETIC) 20-12.5 MG tablet, Take 1 tablet by mouth daily., Disp: , Rfl:    Vitamin D, Ergocalciferol, (DRISDOL) 1.25 MG (50000 UNIT) CAPS capsule, Take 50,000 Units by mouth every Sunday., Disp: , Rfl:   Vitals   Vitals:   2023-07-28 1845 07/28/23 1846 07/28/2023 1857 07-28-23 1900  BP: (!) 156/89  (!) 168/88 (!) 168/83  Pulse: 86  90 81  Resp:   18 19  Temp: 97.9 F (36.6 C)  98.7 F (37.1 C)   SpO2: 97%  98% 97%  Weight:  83.8 kg    Height:  5\' 3"  (1.6 m)      Body mass index is 32.73 kg/m.  Physical Exam   Gen: patient lying in bed, NAD CV: extremities appear well-perfused Resp: normal WOB  Neurologic Examination   MS: alert, oriented to self, hospital, year, and age but not month. Patient easily confused in conversation and is unable to provide comprehensive history, see above.   Speech: mild-to-moderate dysarthria, no aphasia CN: PERRL, VFF, EOMI, sensation intact, L facial droop, hearing intact to voice Motor: 5/5 strength throughout Sensory: SILT Reflexes: 2+ symm with toes down bilat Coordination: FNF intact bilat Gait: deferred  Labs/Imaging/Neurodiagnostic studies   CBC:  Recent Labs  Lab July 28, 2023 1821 July 28, 2023 1852  WBC  --  8.6  NEUTROABS  --  6.0  HGB 13.3 13.5  HCT 39.0 39.5  MCV  --  88.0  PLT  --  319   Basic Metabolic Panel:  Lab Results  Component Value Date   NA 138 2023/07/28   K 3.8 07/28/23   CO2 21 (L) July 28, 2023   GLUCOSE 108 (H) 2023/07/28   BUN 23 2023/07/28   CREATININE 0.84 07/28/2023   CALCIUM 9.5 2023-07-28   GFRNONAA >60 07/28/2023   GFRAA >60 06/19/2019   Lipid Panel: No results found for: "LDLCALC" HgbA1c:  Lab Results   Component Value Date   HGBA1C 5.5 06/19/2019   Urine Drug Screen:     Component Value Date/Time   LABOPIA NONE DETECTED 07-28-23 1942   COCAINSCRNUR NONE DETECTED 07-28-23 1942   LABBENZ NONE DETECTED 28-Jul-2023 1942   AMPHETMU NONE DETECTED 07-28-23  1942   THCU NONE DETECTED 07/14/2023 1942   LABBARB NONE DETECTED 07/14/2023 1942    Alcohol Level     Component Value Date/Time   ETH <15 07/14/2023 1852   INR  Lab Results  Component Value Date   INR 1.0 07/14/2023   APTT  Lab Results  Component Value Date   APTT 31 07/14/2023   AED levels: No results found for: "PHENYTOIN", "ZONISAMIDE", "LAMOTRIGINE", "LEVETIRACETA"  CT Head without contrast(Personally reviewed): No acute process   ASSESSMENT   Kathryn Cameron is a 68 y.o. female history of hypertension who presented with acute onset dysarthria concerning for acute ischemic stroke.  She was outside of the window for TNK.  Exam was not consistent with LVO therefore she was not an interventional candidate.  RECOMMENDATIONS   - Admit for stroke workup - Permissive HTN x48 hrs from sx onset or until stroke ruled out by MRI goal BP <220/110. PRN labetalol or hydralazine  if BP above these parameters. Avoid oral antihypertensives. - MRI brain wo contrast - CTA/MRA if not already obtained - TTE w/ bubble - Check A1c and LDL + add statin per guidelines - ASA 81mg  daily + plavix 75mg  daily x21 days f/b ASA 81mg  daily monotherapy after that - q4 hr neuro checks - STAT head CT for any change in neuro exam - Tele - PT/OT/SLP - Stroke education - Amb referral to neurology upon discharge   Stroke team will follow in consult tomorrow  ______________________________________________________________________    Signed, Eleni Griffin, MD [facility]

## 2023-07-14 NOTE — ED Notes (Signed)
 This nurse informed attending Sundil, MD that the aspirin will be held until patient goes to and comes back from MRI to better assess patient for any reactions to medication. Attending Sundil, MD agreed.

## 2023-07-15 ENCOUNTER — Other Ambulatory Visit (HOSPITAL_COMMUNITY)

## 2023-07-15 ENCOUNTER — Inpatient Hospital Stay (HOSPITAL_COMMUNITY)

## 2023-07-15 DIAGNOSIS — I639 Cerebral infarction, unspecified: Secondary | ICD-10-CM | POA: Diagnosis not present

## 2023-07-15 DIAGNOSIS — I63411 Cerebral infarction due to embolism of right middle cerebral artery: Secondary | ICD-10-CM | POA: Diagnosis not present

## 2023-07-15 DIAGNOSIS — R002 Palpitations: Secondary | ICD-10-CM | POA: Diagnosis not present

## 2023-07-15 DIAGNOSIS — I10 Essential (primary) hypertension: Secondary | ICD-10-CM | POA: Diagnosis not present

## 2023-07-15 DIAGNOSIS — E785 Hyperlipidemia, unspecified: Secondary | ICD-10-CM | POA: Diagnosis not present

## 2023-07-15 LAB — COMPREHENSIVE METABOLIC PANEL WITH GFR
ALT: 17 U/L (ref 0–44)
AST: 19 U/L (ref 15–41)
Albumin: 3.4 g/dL — ABNORMAL LOW (ref 3.5–5.0)
Alkaline Phosphatase: 84 U/L (ref 38–126)
Anion gap: 9 (ref 5–15)
BUN: 18 mg/dL (ref 8–23)
CO2: 24 mmol/L (ref 22–32)
Calcium: 9.2 mg/dL (ref 8.9–10.3)
Chloride: 105 mmol/L (ref 98–111)
Creatinine, Ser: 0.88 mg/dL (ref 0.44–1.00)
GFR, Estimated: >60 mL/min (ref >60)
Glucose, Bld: 95 mg/dL (ref 70–99)
Potassium: 3.5 mmol/L (ref 3.5–5.1)
Sodium: 138 mmol/L (ref 135–145)
Total Bilirubin: 0.8 mg/dL (ref 0.0–1.2)
Total Protein: 5.9 g/dL — ABNORMAL LOW (ref 6.5–8.1)

## 2023-07-15 LAB — CBC
HCT: 37.6 % (ref 36.0–46.0)
Hemoglobin: 12.6 g/dL (ref 12.0–15.0)
MCH: 29.4 pg (ref 26.0–34.0)
MCHC: 33.5 g/dL (ref 30.0–36.0)
MCV: 87.9 fL (ref 80.0–100.0)
Platelets: 297 10*3/uL (ref 150–400)
RBC: 4.28 MIL/uL (ref 3.87–5.11)
RDW: 13.1 % (ref 11.5–15.5)
WBC: 6.5 10*3/uL (ref 4.0–10.5)
nRBC: 0 % (ref 0.0–0.2)

## 2023-07-15 LAB — LIPID PANEL
Cholesterol: 156 mg/dL (ref 0–200)
HDL: 40 mg/dL — ABNORMAL LOW (ref >40)
LDL Cholesterol: 96 mg/dL (ref 0–99)
Total CHOL/HDL Ratio: 3.9 ratio
Triglycerides: 101 mg/dL (ref <150)
VLDL: 20 mg/dL (ref 0–40)

## 2023-07-15 LAB — HIV ANTIBODY (ROUTINE TESTING W REFLEX): HIV Screen 4th Generation wRfx: NONREACTIVE

## 2023-07-15 MED ORDER — HYDRALAZINE HCL 20 MG/ML IJ SOLN
10.0000 mg | Freq: Four times a day (QID) | INTRAMUSCULAR | Status: DC | PRN
  Administered 2023-07-15: 10 mg via INTRAVENOUS
  Filled 2023-07-15: qty 1

## 2023-07-15 MED ORDER — DIPHENHYDRAMINE HCL 50 MG/ML IJ SOLN
12.5000 mg | Freq: Four times a day (QID) | INTRAMUSCULAR | Status: DC | PRN
  Administered 2023-07-15: 12.5 mg via INTRAVENOUS
  Filled 2023-07-15: qty 1

## 2023-07-15 MED ORDER — ATORVASTATIN CALCIUM 40 MG PO TABS
40.0000 mg | ORAL_TABLET | Freq: Every day | ORAL | Status: DC
  Administered 2023-07-15 – 2023-07-18 (×4): 40 mg via ORAL
  Filled 2023-07-15: qty 1
  Filled 2023-07-15: qty 4
  Filled 2023-07-15 (×2): qty 1

## 2023-07-15 MED ORDER — METOCLOPRAMIDE HCL 5 MG/ML IJ SOLN
10.0000 mg | Freq: Four times a day (QID) | INTRAMUSCULAR | Status: DC | PRN
  Administered 2023-07-15: 10 mg via INTRAVENOUS
  Filled 2023-07-15: qty 2

## 2023-07-15 NOTE — Progress Notes (Signed)
 PT Cancellation Note  Patient Details Name: MIKENA MASONER MRN: [id number] DOB: [birth year]   Cancelled Treatment:    Reason Eval/Treat Not Completed: Patient at procedure or test/unavailable (PT consult appreciated and chart reviewed. Pt off floor for MRI and IR.) Will follow-up for PT evaluation as schedule permits.   Glenford Lanes, PT, DPT Acute Rehabilitation Services Office: [phone] Secure Chat Preferred  Riva Chester 07/15/2023, 8:39 AM

## 2023-07-15 NOTE — Assessment & Plan Note (Signed)
-   LDL 96 - Continue Lipitor

## 2023-07-15 NOTE — Evaluation (Signed)
 Speech Language Pathology Evaluation Patient Details Name: Kathryn Cameron MRN: [id number] DOB: 1955-03-30 Today's Date: 07/15/2023 Time: 2952-8413 SLP Time Calculation (min) (ACUTE ONLY): 25 min  Problem List:  Patient Active Problem List   Diagnosis Date Noted   Acute ischemic cerebrovascular accident (CVA) (HCC) 07/14/2023   Hyperlipidemia 07/14/2023   Obstructive sleep apnea 07/14/2023   Acute stroke due to ischemia [facility]) 07/14/2023   Neurologic deficit due to acute ischemic cerebrovascular accident (CVA) (HCC) 07/14/2023   Status post laparoscopic herniorrhaphy 06/26/2019   Essential hypertension 11/18/2014   Headache 11/18/2014   Trouble swallowing 11/18/2014   Past Medical History:  Past Medical History:  Diagnosis Date   Chicken pox    Hypertension    Past Surgical History:  Past Surgical History:  Procedure Laterality Date   LAPAROSCOPIC HYSTERECTOMY     left one ovary in place   XI ROBOTIC ASSISTED INGUINAL HERNIA REPAIR WITH MESH Bilateral 06/23/2019   Procedure: XI ROBOTIC ASSISTED Bilateral Incarcerated INGUINAL HERNIA REPAIR WITH MESH;  Surgeon: Flynn Hylan, MD;  Location: [facility] ORS;  Service: General;  Laterality: Bilateral;   HPI:  Kathryn Cameron is a 68 yo female with PMH HTN, OSA, B12 deficiency, vitamin D deficiency who presented after developing slurred speech and confusion.  There was also report of left-sided facial droop left-sided numbness.  She was admitted for stroke workup.  MRI brain showed multiple acute infarcts involving right MCA.   Assessment / Plan / Recommendation Clinical Impression  Cognitive-linguistic evaluation complete. Patient lethargic but agreeable to testing, stating that she had a small headache which was worse with lights on. Lights were turned on per patient request however and then patient with eyes closed for most of evaluation. She presents with decreased sustained attention, emergent awareness, thought organization, and  working/short term memory. Higher level problem solving and reasoning intact however. Verbally, she presents with frequent errors, marked by phonemic substitutions and slurred output however this is not consistent and occurred more during spontaneous verbal output at the conversation level. When word finding/naming tested formally via confrontation naming tasks including both convergent and divergent naming, skills WFL. Difficult to determine cause; dysarthria (although oral motor exam [facility]), cognitively based, and/or aphasia (less likely given location of CVA). Suspect that deficits are largely cognitively based on nature but will need ongoing diagnostic treatment.  Per patient she was living independently prior to admission and desires to return home ASAP. When asked about potential rehab, she stated no firmly. Wonder if a short inpatient rehab stay would be more agreeable for her pending PT/OT evaluations. At this time, SLP would recommend 24 hour care given cognitive-linguistic deficits.    SLP Assessment  SLP Recommendation/Assessment: Patient needs continued Speech Language Pathology Services SLP Visit Diagnosis: Cognitive communication deficit (R41.841)     Assistance Recommended at Discharge  Frequent or constant Supervision/Assistance  Functional Status Assessment Patient has had a recent decline in their functional status and demonstrates the ability to make significant improvements in function in a reasonable and predictable amount of time.  Frequency and Duration min 2x/week  2 weeks      SLP Evaluation Cognition  Overall Cognitive Status: Impaired/Different from baseline Arousal/Alertness: Lethargic Orientation Level: Oriented to person;Oriented to place;Oriented to time;Oriented to situation Attention: Sustained Sustained Attention: Impaired Sustained Attention Impairment: Verbal complex Memory: Impaired Memory Impairment: Retrieval deficit Awareness: Impaired Awareness  Impairment: Anticipatory impairment Problem Solving: Appears intact Safety/Judgment:  (TBD)       Comprehension  Auditory Comprehension Overall Auditory Comprehension: Impaired  Yes/No Questions: Within Functional Limits Commands: Impaired Multistep Basic Commands: 75-100% accurate Interfering Components: Attention Visual Recognition/Discrimination Discrimination: Within Function Limits Reading Comprehension Reading Status: Not tested    Expression Expression Primary Mode of Expression: Verbal Verbal Expression Overall Verbal Expression: Impaired Initiation: No impairment Automatic Speech: Name;Social Response Level of Generative/Spontaneous Verbalization: Conversation Repetition: No impairment Naming:  (see impression statement) Pragmatics:  (eyes closed for most of eval, could be related to headache and light sensativity) Interfering Components: Attention Written Expression Written Expression: Not tested   Oral / Motor  Oral Motor/Sensory Function Overall Oral Motor/Sensory Function: Within functional limits Motor Speech Overall Motor Speech: Impaired           Kathryn Fife MA, CCC-SLP  Kathryn Cameron Kathryn Cameron 07/15/2023, 3:21 PM

## 2023-07-15 NOTE — Progress Notes (Signed)
 Progress Note    NAKHIA LEVITAN   JYN:[id number]  DOB: 07-Jan-1956  DOA: 07/14/2023     1 PCP: Lenell Query, PA-C  Initial CC: Dysarthria, confusion  Hospital Course: Kathryn Cameron is a 68 yo female with PMH HTN, OSA, B12 deficiency, vitamin D deficiency who presented after developing slurred speech and confusion.  There was also report of left-sided facial droop left-sided numbness. She was admitted for stroke workup. MRI brain showed multiple acute infarcts involving right MCA.  CT angio head/neck showed bilateral ICA fusiform dilation. Patient had also mentioned feeling elevated heart rate and palpitations over the past couple months.  She states her rate was as fast as in the 160s when she measured it at home.  She has no known history of arrhythmia or cardiac issues otherwise. She was admitted for ongoing stroke workup.  Interval History:  Sitting in bed in the ER when seen.  Mostly appears to be somewhat confused still and having difficulty stating her thoughts at times.  Does not appear to have any obvious weakness or numbness.  She had forgotten what the MRI brain showed and was re-explained to her.  Assessment and Plan: * Acute ischemic cerebrovascular accident (CVA) (HCC) - Presented with dysarthria, confusion, and what appears to be expressive aphasia -MRI brain shows multiple acute infarcts involving middle right MCA at the right MCA/PCA watershed area - CT angio head neck shows right ICA focal fusiform dilation with max diameter 10 mm; also shows fusiform dilation of the left ICA - On-call IR spoken with after admission and tentative plan is for arteriogram on Monday - Neurology also following, appreciate assistance - Follow-up echo.  Given recent self-reported complaints of palpitations and tachycardia, would benefit from loop recorder.  Neurology planning to discuss with EP on Monday - Continue aspirin and Plavix for 21 days followed by monotherapy aspirin per neuro  recs -LDL 96; continue Lipitor - A1c pending -Follow-up PT, OT, SLP evals  Hyperlipidemia - LDL 96 - Continue Lipitor  Essential hypertension - Permissive hypertension.  Goal BP less than 180/105   Old records reviewed in assessment of this patient  Antimicrobials:   DVT prophylaxis:  enoxaparin  (LOVENOX ) injection 40 mg Start: 07/14/23 2000   Code Status:   Code Status: Full Code  Mobility Assessment (Last 72 Hours)     Mobility Assessment   No documentation.           Barriers to discharge: None Disposition Plan: Likely home HH orders placed: TBD Status is: Inpatient  Objective: Blood pressure (!) 153/74, pulse 85, temperature 97.7 F (36.5 C), resp. rate 11, height 5\' 3"  (1.6 m), weight 83.8 kg, SpO2 99%.  Examination:  Physical Exam Constitutional:      Appearance: Normal appearance.  HENT:     Head: Normocephalic and atraumatic.     Mouth/Throat:     Mouth: Mucous membranes are moist.  Eyes:     Extraocular Movements: Extraocular movements intact.  Cardiovascular:     Rate and Rhythm: Normal rate and regular rhythm.     Comments: Systolic click appreciated Pulmonary:     Effort: Pulmonary effort is normal. No respiratory distress.     Breath sounds: Normal breath sounds. No wheezing.  Abdominal:     General: Bowel sounds are normal. There is no distension.     Palpations: Abdomen is soft.     Tenderness: There is no abdominal tenderness.  Musculoskeletal:        General: Normal range of  motion.     Cervical back: Normal range of motion and neck supple.  Skin:    General: Skin is warm and dry.  Neurological:     Mental Status: She is alert.     Comments: Strength and sensation intact throughout.  Mild dysarthria and persistent confusion/receptive aphasia  Psychiatric:        Mood and Affect: Mood normal.      Consultants:  Neurology IR  Procedures:  07/16/2023: Tentative cerebral arteriogram  Data Reviewed: Results for orders  placed or performed during the hospital encounter of 07/14/23 (from the past 24 hours)  I-stat chem 8, ed     Status: Abnormal   Collection Time: 07/14/23  6:21 PM  Result Value Ref Range   Sodium 138 135 - 145 mmol/L   Potassium 3.8 3.5 - 5.1 mmol/L   Chloride 104 98 - 111 mmol/L   BUN 23 8 - 23 mg/dL   Creatinine, Ser 6.96 0.44 - 1.00 mg/dL   Glucose, Bld 295 (H) 70 - 99 mg/dL   Calcium, Ion 2.84 1.32 - 1.40 mmol/L   TCO2 20 (L) 22 - 32 mmol/L   Hemoglobin 13.3 12.0 - 15.0 g/dL   HCT 44.0 10.2 - 72.5 %  Ethanol     Status: None   Collection Time: 07/14/23  6:52 PM  Result Value Ref Range   Alcohol, Ethyl (B) <15 <15 mg/dL  Protime-INR     Status: None   Collection Time: 07/14/23  6:52 PM  Result Value Ref Range   Prothrombin Time 13.6 11.4 - 15.2 seconds   INR 1.0 0.8 - 1.2  APTT     Status: None   Collection Time: 07/14/23  6:52 PM  Result Value Ref Range   aPTT 31 24 - 36 seconds  CBC     Status: None   Collection Time: 07/14/23  6:52 PM  Result Value Ref Range   WBC 8.6 4.0 - 10.5 K/uL   RBC 4.49 3.87 - 5.11 MIL/uL   Hemoglobin 13.5 12.0 - 15.0 g/dL   HCT 36.6 44.0 - 34.7 %   MCV 88.0 80.0 - 100.0 fL   MCH 30.1 26.0 - 34.0 pg   MCHC 34.2 30.0 - 36.0 g/dL   RDW 42.5 95.6 - 38.7 %   Platelets 319 150 - 400 K/uL   nRBC 0.0 0.0 - 0.2 %  Differential     Status: None   Collection Time: 07/14/23  6:52 PM  Result Value Ref Range   Neutrophils Relative % 69 %   Neutro Abs 6.0 1.7 - 7.7 K/uL   Lymphocytes Relative 19 %   Lymphs Abs 1.6 0.7 - 4.0 K/uL   Monocytes Relative 10 %   Monocytes Absolute 0.8 0.1 - 1.0 K/uL   Eosinophils Relative 2 %   Eosinophils Absolute 0.1 0.0 - 0.5 K/uL   Basophils Relative 0 %   Basophils Absolute 0.0 0.0 - 0.1 K/uL   Immature Granulocytes 0 %   Abs Immature Granulocytes 0.01 0.00 - 0.07 K/uL  Comprehensive metabolic panel     Status: Abnormal   Collection Time: 07/14/23  6:52 PM  Result Value Ref Range   Sodium 138 135 - 145 mmol/L    Potassium 3.8 3.5 - 5.1 mmol/L   Chloride 105 98 - 111 mmol/L   CO2 21 (L) 22 - 32 mmol/L   Glucose, Bld 108 (H) 70 - 99 mg/dL   BUN 23 8 - 23 mg/dL   Creatinine,  Ser 0.84 0.44 - 1.00 mg/dL   Calcium 9.5 8.9 - 16.1 mg/dL   Total Protein 6.5 6.5 - 8.1 g/dL   Albumin 3.8 3.5 - 5.0 g/dL   AST 19 15 - 41 U/L   ALT 19 0 - 44 U/L   Alkaline Phosphatase 89 38 - 126 U/L   Total Bilirubin 0.8 0.0 - 1.2 mg/dL   GFR, Estimated >09 >60 mL/min   Anion gap 12 5 - 15  Urine rapid drug screen (hosp performed)     Status: None   Collection Time: 07/14/23  7:42 PM  Result Value Ref Range   Opiates NONE DETECTED NONE DETECTED   Cocaine NONE DETECTED NONE DETECTED   Benzodiazepines NONE DETECTED NONE DETECTED   Amphetamines NONE DETECTED NONE DETECTED   Tetrahydrocannabinol NONE DETECTED NONE DETECTED   Barbiturates NONE DETECTED NONE DETECTED  HIV Antibody (routine testing w rflx)     Status: None   Collection Time: 07/15/23  5:03 AM  Result Value Ref Range   HIV Screen 4th Generation wRfx Non Reactive Non Reactive  Lipid panel     Status: Abnormal   Collection Time: 07/15/23  5:03 AM  Result Value Ref Range   Cholesterol 156 0 - 200 mg/dL   Triglycerides 454 <098 mg/dL   HDL 40 (L) >11 mg/dL   Total CHOL/HDL Ratio 3.9 RATIO   VLDL 20 0 - 40 mg/dL   LDL Cholesterol 96 0 - 99 mg/dL  CBC     Status: None   Collection Time: 07/15/23  5:03 AM  Result Value Ref Range   WBC 6.5 4.0 - 10.5 K/uL   RBC 4.28 3.87 - 5.11 MIL/uL   Hemoglobin 12.6 12.0 - 15.0 g/dL   HCT 91.4 78.2 - 95.6 %   MCV 87.9 80.0 - 100.0 fL   MCH 29.4 26.0 - 34.0 pg   MCHC 33.5 30.0 - 36.0 g/dL   RDW 21.3 08.6 - 57.8 %   Platelets 297 150 - 400 K/uL   nRBC 0.0 0.0 - 0.2 %  Comprehensive metabolic panel     Status: Abnormal   Collection Time: 07/15/23  5:03 AM  Result Value Ref Range   Sodium 138 135 - 145 mmol/L   Potassium 3.5 3.5 - 5.1 mmol/L   Chloride 105 98 - 111 mmol/L   CO2 24 22 - 32 mmol/L   Glucose, Bld  95 70 - 99 mg/dL   BUN 18 8 - 23 mg/dL   Creatinine, Ser 4.69 0.44 - 1.00 mg/dL   Calcium 9.2 8.9 - 62.9 mg/dL   Total Protein 5.9 (L) 6.5 - 8.1 g/dL   Albumin 3.4 (L) 3.5 - 5.0 g/dL   AST 19 15 - 41 U/L   ALT 17 0 - 44 U/L   Alkaline Phosphatase 84 38 - 126 U/L   Total Bilirubin 0.8 0.0 - 1.2 mg/dL   GFR, Estimated >52 >84 mL/min   Anion gap 9 5 - 15    I have reviewed pertinent nursing notes, vitals, labs, and images as necessary. I have ordered labwork to follow up on as indicated.  I have reviewed the last notes from staff over past 24 hours. I have discussed patient's care plan and test results with nursing staff, CM/SW, and other staff as appropriate.  Time spent: Greater than 50% of the 55 minute visit was spent in counseling/coordination of care for the patient as laid out in the A&P.   LOS: 1 day  Faith Homes, MD [facility] 07/15/2023, 1:28 PM

## 2023-07-15 NOTE — Consult Note (Signed)
 Chief Complaint: Recent slurred speech, word finding difficulty, right middle cerebral artery region stroke; referred for diagnostic cerebral arteriogram  Referring Provider(s): Sundil,S  Supervising Physician: Luellen Sages  Patient Status: [facility] - ED  History of Present Illness: Kathryn Cameron is a 68 y.o. female nonsmoker with PMH sig for HTN, inguinal hernia repair, OSA, vit B12/D deficiency, prior hx temporary monocular vision loss about 15 years ago (? Rt eye) who presented to [facility] on 6/7 with slurred speech, left facial droop, confusion, word finding difficulty and some left face/arm paresthesias. CT head revealed:   1. Focal hypoattenuation and loss of gray-white differentiation in the posterior aspect of the right MCA territory concerning for acute infarct. 2. No acute intracranial hemorrhage. 3. Mild chronic microvascular ischemic changes  CT angio head/neck revealed:   1. No emergent large vessel occlusion or high-grade stenosis of the intracranial arteries. 2. Fusiform dilatation of the distal left ICA with a fenestration or dissection flap. 3. Focal fusiform dilatation of the distal right ICA just proximal to the skull base with a maximum diameter of 10 mm. 4. No hemodynamically significant stenosis of the cervical carotid or vertebral arteries   MR brain revealed:   1. Multifocal Acute infarcts scattered in the middle Right MCA division, at the Right MCA/PCA watershed area. No hemorrhagic transformation or mass effect. 2. Mild for age underlying white matter disease, small chronic lacunar infarct in the right caudate nucleus.  Pt is currently afebrile, CBC nl, creat nl, PT/INR nl; on lovenox , plavix, aspirin;  request now received for diagnostic cerebral arteriogram   Patient is Full Code  Past Medical History:  Diagnosis Date   Chicken pox    Hypertension     Past Surgical History:  Procedure Laterality Date   LAPAROSCOPIC HYSTERECTOMY     left  one ovary in place   XI ROBOTIC ASSISTED INGUINAL HERNIA REPAIR WITH MESH Bilateral 06/23/2019   Procedure: XI ROBOTIC ASSISTED Bilateral Incarcerated INGUINAL HERNIA REPAIR WITH MESH;  Surgeon: Flynn Hylan, MD;  Location: [facility] ORS;  Service: General;  Laterality: Bilateral;    Allergies: Naproxen  Medications: Prior to Admission medications   Medication Sig Start Date End Date Taking? Authorizing Provider  amLODipine  (NORVASC ) 2.5 MG tablet Take 2.5 mg by mouth daily. 03/06/23 03/05/24 Yes [provider]  lisinopril-hydrochlorothiazide (ZESTORETIC) 20-12.5 MG tablet Take 1 tablet by mouth daily. 03/06/23 03/05/24 Yes [provider]  Vitamin D, Ergocalciferol, (DRISDOL) 1.25 MG (50000 UNIT) CAPS capsule Take 50,000 Units by mouth every Sunday. 06/05/23  Yes [provider]     Family History  Problem Relation Age of Onset   Stroke Father        in his 72's   Hypertension Other        parent   Diabetes Other        parent    Social History   Socioeconomic History   Marital status: Divorced    Spouse name: Not on file   Number of children: Not on file   Years of education: Not on file   Highest education level: Not on file  Occupational History   Not on file  Tobacco Use   Smoking status: Never   Smokeless tobacco: Never  Substance and Sexual Activity   Alcohol use: No    Alcohol/week: 0.0 standard drinks of alcohol   Drug use: No   Sexual activity: Not on file  Other Topics Concern   Not on file  Social History Narrative  Not on file   Social Drivers of Health   Financial Resource Strain: Low Risk  (06/07/2023)   Received from [facility]   Overall Financial Resource Strain (CARDIA)    Difficulty of Paying Living Expenses: Not hard at all  Food Insecurity: No Food Insecurity (06/07/2023)   Received from [facility]   Hunger Vital Sign    Worried About Running Out of Food in the Last Year: Never true     Ran Out of Food in the Last Year: Never true  Transportation Needs: No Transportation Needs (06/07/2023)   Received from [facility] - Transportation    In the past 12 months, has lack of transportation kept you from medical appointments or from getting medications?: No    Lack of Transportation (Non-Medical): No  Physical Activity: Not on file  Stress: Not on file  Social Connections: Not on file     \  Review of Systems; see above; denies fever, CP,dyspnea, cough, back pain,N/V or bleeding; she does have HA, some mild abd soreness  Vital Signs: BP (!) 153/74   Pulse 85   Temp 97.7 F (36.5 C)   Resp 11   Ht 5\' 3"  (1.6 m)   Wt 184 lb 11.9 oz (83.8 kg)   SpO2 99%   BMI 32.73 kg/m   Advance Care Plan: \no documents on file.  Physical Exam pt awake, oriented to person, birthdate, place, year, not day of week; when asking questions patient had few pauses to think and formulation of a sentence however able to answer all the questions appropriately; tongue midline; face symm , strength UE/LE equal at this time; chest- CTA bilat; heart- RRR; abd-soft,+BS, some mild gen tenderness to palpation, no LE edema; no paresthesias at this time  Imaging: MR BRAIN WO CONTRAST Result Date: 07/15/2023 CLINICAL DATA:  68 year old female code stroke presentation yesterday. EXAM: MRI HEAD WITHOUT CONTRAST TECHNIQUE: Multiplanar, multiecho pulse sequences of the brain and surrounding structures were obtained without intravenous contrast. COMPARISON:  CT head, CTA head and neck yesterday. FINDINGS: Brain: Multifocal patchy and confluent restricted diffusion in the right hemisphere, ranging from small areas of cortical and white matter involvement at the right insula to more confluent involvement along the right posterior operculum, 2 a 4 cm area of confluent involvement at the anterior right occipital lobe from the periventricular white matter to the lateral occipital cortex  (series 3, image 29). T2 and FLAIR hyperintense cytotoxic edema in the affected areas. No convincing hemorrhagic transformation. No significant intracranial mass effect. No other diffusion restriction. No midline shift, mass effect, evidence of mass lesion, ventriculomegaly, extra-axial collection or acute intracranial hemorrhage. Cervicomedullary junction and pituitary are within normal limits. Scattered mild for age nonspecific cerebral white matter T2 and FLAIR hyperintensity elsewhere. No chronic cortical encephalomalacia or chronic cerebral blood products identified. Small chronic lacunar infarct of the right caudate nucleus. Deep gray nuclei, brainstem, cerebellum otherwise appear negative. Vascular: Major intracranial vascular flow voids are preserved, dominant distal right vertebral artery as seen by CTA. Skull and upper cervical spine: Normal for age. Visualized bone marrow signal is within normal limits. Sinuses/Orbits: Negative. Paranasal Visualized paranasal sinuses and mastoids are stable and well aerated. Other: Visible internal auditory structures appear normal. IMPRESSION: 1. Multifocal Acute infarcts scattered in the middle Right MCA division, at the Right MCA/PCA watershed area. No hemorrhagic transformation or mass effect. 2. Mild for age underlying white matter disease, small chronic lacunar infarct in  the right caudate nucleus. Electronically Signed   By: Marlise Simpers M.D.   On: 07/15/2023 09:58   CT ANGIO HEAD NECK W WO CM Result Date: 07/14/2023 CLINICAL DATA:  Acute ischemic attack EXAM: CT ANGIOGRAPHY HEAD AND NECK WITH AND WITHOUT CONTRAST TECHNIQUE: Multidetector CT imaging of the head and neck was performed using the standard protocol during bolus administration of intravenous contrast. Multiplanar CT image reconstructions and MIPs were obtained to evaluate the vascular anatomy. Carotid stenosis measurements (when applicable) are obtained utilizing NASCET criteria, using the distal internal  carotid diameter as the denominator. RADIATION DOSE REDUCTION: This exam was performed according to the departmental dose-optimization program which includes automated exposure control, adjustment of the mA and/or kV according to patient size and/or use of iterative reconstruction technique. CONTRAST:  75mL OMNIPAQUE  IOHEXOL  350 MG/ML SOLN COMPARISON:  None Available. FINDINGS: CTA NECK FINDINGS Skeleton: No acute abnormality or high grade bony spinal canal stenosis. Other neck: Normal pharynx, larynx and major salivary glands. No cervical lymphadenopathy. Unremarkable thyroid gland. Upper chest: No pneumothorax or pleural effusion. No nodules or masses. Aortic arch: There is no calcific atherosclerosis of the aortic arch. Conventional 3 vessel aortic branching pattern. RIGHT carotid system: The common carotid artery and carotid bifurcation are normal. There is focal fusiform dilatation of the distal ICA just proximal to the skull base with a maximum diameter of 10 mm. LEFT carotid system: The common carotid artery and carotid bifurcation are normal. The distal ICA is extremely tortuous with dilatation measuring up to 11 mm. There is a fenestration or dissection flap (5:143 and 8:121). Vertebral arteries: Right dominant configuration. Minimal atherosclerosis of both vertebral arteries are widely patent. CTA HEAD FINDINGS POSTERIOR CIRCULATION: Normal vertebral artery V4 segments. No proximal occlusion of the anterior or inferior cerebellar arteries. Basilar artery is normal. Superior cerebellar arteries are normal. Posterior cerebral arteries are normal. ANTERIOR CIRCULATION: Intracranial internal carotid arteries are normal. Anterior cerebral arteries are normal. Middle cerebral arteries are normal. Venous sinuses: As permitted by contrast timing, patent. Anatomic variants: Patent right P-comm Review of the MIP images confirms the above findings. IMPRESSION: 1. No emergent large vessel occlusion or high-grade  stenosis of the intracranial arteries. 2. Fusiform dilatation of the distal left ICA with a fenestration or dissection flap. 3. Focal fusiform dilatation of the distal right ICA just proximal to the skull base with a maximum diameter of 10 mm. 4. No hemodynamically significant stenosis of the cervical carotid or vertebral arteries. Electronically Signed   By: Juanetta Nordmann M.D.   On: 07/14/2023 20:40   CT HEAD CODE STROKE WO CONTRAST Result Date: 07/14/2023 CLINICAL DATA:  Code stroke. Neuro deficit, concern for stroke, right-sided facial droop, confusion, slurred speech. EXAM: CT HEAD WITHOUT CONTRAST TECHNIQUE: Contiguous axial images were obtained from the base of the skull through the vertex without intravenous contrast. RADIATION DOSE REDUCTION: This exam was performed according to the departmental dose-optimization program which includes automated exposure control, adjustment of the mA and/or kV according to patient size and/or use of iterative reconstruction technique. COMPARISON:  None Available. FINDINGS: Brain: No acute intracranial hemorrhage. Hypoattenuation and loss of gray-white differentiation in the posterior aspect of the right cerebral hemisphere involving the posterior right temporal lobe, lateral right occipital lobe and a portion of the right inferior parietal lobule. Nonspecific hypoattenuation in the periventricular and subcortical white matter favored to reflect chronic microvascular ischemic changes. No significant mass effect or midline shift. The basilar cisterns are patent. Posterior fossa is unremarkable. Ventricles: The  ventricles are normal. Vascular: No hyperdense vessel or unexpected calcification. Skull: No acute or aggressive finding. Orbits: Orbits are symmetric. Sinuses: The visualized paranasal sinuses are clear. Other: Mastoid air cells are clear. ASPECTS [facility] Stroke Program Early CT Score) - Ganglionic level infarction (caudate, lentiform nuclei, internal capsule,  insula, M1-M3 cortex): 6 - Supraganglionic infarction (M4-M6 cortex): 3 Total score (0-10 with 10 being normal): 9 IMPRESSION: 1. Focal hypoattenuation and loss of gray-white differentiation in the posterior aspect of the right MCA territory concerning for acute infarct. 2. No acute intracranial hemorrhage. 3. Mild chronic microvascular ischemic changes. 4. ASPECTS is 9 These results were communicated to Dr. Doretta Gant At 6:47 pm on 07/14/2023 by text page via the [facility] messaging system. Electronically Signed   By: Denny Flack M.D.   On: 07/14/2023 18:47    Labs:  CBC: Recent Labs    07/14/23 1821 07/14/23 1852 07/15/23 0503  WBC  --  8.6 6.5  HGB 13.3 13.5 12.6  HCT 39.0 39.5 37.6  PLT  --  319 297    COAGS: Recent Labs    07/14/23 1852  INR 1.0  APTT 31    BMP: Recent Labs    07/14/23 1821 07/14/23 1852 07/15/23 0503  NA 138 138 138  K 3.8 3.8 3.5  CL 104 105 105  CO2  --  21* 24  GLUCOSE 109* 108* 95  BUN 23 23 18   CALCIUM  --  9.5 9.2  CREATININE 0.90 0.84 0.88  GFRNONAA  --  >60 >60    LIVER FUNCTION TESTS: Recent Labs    07/14/23 1852 07/15/23 0503  BILITOT 0.8 0.8  AST 19 19  ALT 19 17  ALKPHOS 89 84  PROT 6.5 5.9*  ALBUMIN 3.8 3.4*    TUMOR MARKERS: No results for input(s): "AFPTM", "CEA", "CA199", "CHROMGRNA" in the last 8760 hours.  Assessment and Plan: 68 y.o. female nonsmoker with PMH sig for HTN, inguinal hernia repair, OSA, vit B12/D deficiency, prior hx temporary monocular vision loss about 15 years ago (? Rt eye) who presented to [facility] on 6/7 with slurred speech, left facial droop, confusion, word finding difficulty and some left face/arm paresthesias. CT head revealed:   1. Focal hypoattenuation and loss of gray-white differentiation in the posterior aspect of the right MCA territory concerning for acute infarct. 2. No acute intracranial hemorrhage. 3. Mild chronic microvascular ischemic changes  CT angio head/neck revealed:   1. No  emergent large vessel occlusion or high-grade stenosis of the intracranial arteries. 2. Fusiform dilatation of the distal left ICA with a fenestration or dissection flap. 3. Focal fusiform dilatation of the distal right ICA just proximal to the skull base with a maximum diameter of 10 mm. 4. No hemodynamically significant stenosis of the cervical carotid or vertebral arteries   MR brain revealed:   1. Multifocal Acute infarcts scattered in the middle Right MCA division, at the Right MCA/PCA watershed area. No hemorrhagic transformation or mass effect. 2. Mild for age underlying white matter disease, small chronic lacunar infarct in the right caudate nucleus.  Pt is currently afebrile, CBC nl, creat nl, PT/INR nl; on lovenox , plavix, aspirin;  request now received for diagnostic cerebral arteriogram.Dr. Alvira Josephs aware of pt/imaging findings;risks and benefits of procedure were discussed with the patient/daughter including, but not limited to bleeding, infection, vascular injury or contrast induced renal failure.  This interventional procedure involves the use of X-rays and because of the nature of the planned procedure, it is possible that  we will have prolonged use of X-ray fluoroscopy.  Potential radiation risks to you include (but are not limited to) the following: - A slightly elevated risk for cancer  several years later in life. This risk is typically less than 0.5% percent. This risk is low in comparison to the normal incidence of human cancer, which is 33% for women and 50% for men according to the [employer] Society. - Radiation induced injury can include skin redness, resembling a rash, tissue breakdown / ulcers and hair loss (which can be temporary or permanent).   The likelihood of either of these occurring depends on the difficulty of the procedure and whether you are sensitive to radiation due to previous procedures, disease, or genetic conditions.   IF your  procedure requires a prolonged use of radiation, you will be notified and given written instructions for further action.  It is your responsibility to monitor the irradiated area for the 2 weeks following the procedure and to notify your physician if you are concerned that you have suffered a radiation induced injury.    All of the patient's questions were answered, patient is agreeable to proceed.  Consent signed and in chart.  Procedure tent planned for 6/9    Thank you for allowing our service to participate in Kathryn Cameron 's care.  Electronically Signed: D. Honore Lux, PA-C   07/15/2023, 12:59 PM      I spent a total of 40 Minutes    in face to face in clinical consultation, greater than 50% of which was counseling/coordinating care for diagnostic cerebral arteriogram

## 2023-07-15 NOTE — Assessment & Plan Note (Signed)
-   Presented with dysarthria, confusion, and what appears to be expressive aphasia -MRI brain shows multiple acute infarcts involving middle right MCA at the right MCA/PCA watershed area - CT angio head neck shows right ICA focal fusiform dilation with max diameter 10 mm; also shows fusiform dilation of the left ICA - On-call IR spoken with after admission and tentative plan is for arteriogram on Monday - Neurology also following, appreciate assistance - Follow-up echo.  Given recent self-reported complaints of palpitations and tachycardia, would benefit from loop recorder.  Neurology planning to discuss with EP on Monday - Continue aspirin and Plavix for 21 days followed by monotherapy aspirin per neuro recs -LDL 96; continue Lipitor - A1c pending -Follow-up PT, OT, SLP evals

## 2023-07-15 NOTE — ED Notes (Signed)
 Pt otf in no new onset distress at this time.

## 2023-07-15 NOTE — ED Notes (Signed)
 Attempted courtesy call x3 no response from [facility].Unit phone called and courtesy call left.

## 2023-07-15 NOTE — Progress Notes (Signed)
 STROKE TEAM PROGRESS NOTE   SUBJECTIVE (INTERVAL HISTORY) Her RN is at the bedside.  Pt still has frequent paraphasic errors and hesitancy of speech but motor function persevered. Also has left visual field simultagnosia. MRI showed right MCA moderate infarcts.    OBJECTIVE Temp:  [97.7 F (36.5 C)-98.7 F (37.1 C)] 98 F (36.7 C) (06/08 1408) Pulse Rate:  [70-100] 81 (06/08 1400) Cardiac Rhythm: Normal sinus rhythm (06/07 2043) Resp:  [11-24] 22 (06/08 1400) BP: (116-190)/(61-125) 123/98 (06/08 1400) SpO2:  [89 %-100 %] 96 % (06/08 1400) Weight:  [83.8 kg] 83.8 kg (06/07 1846)  No results for input(s): "GLUCAP" in the last 168 hours. Recent Labs  Lab 07/14/23 1821 07/14/23 1852 07/15/23 0503  NA 138 138 138  K 3.8 3.8 3.5  CL 104 105 105  CO2  --  21* 24  GLUCOSE 109* 108* 95  BUN 23 23 18   CREATININE 0.90 0.84 0.88  CALCIUM  --  9.5 9.2   Recent Labs  Lab 07/14/23 1852 07/15/23 0503  AST 19 19  ALT 19 17  ALKPHOS 89 84  BILITOT 0.8 0.8  PROT 6.5 5.9*  ALBUMIN 3.8 3.4*   Recent Labs  Lab 07/14/23 1821 07/14/23 1852 07/15/23 0503  WBC  --  8.6 6.5  NEUTROABS  --  6.0  --   HGB 13.3 13.5 12.6  HCT 39.0 39.5 37.6  MCV  --  88.0 87.9  PLT  --  319 297   No results for input(s): "CKTOTAL", "CKMB", "CKMBINDEX", "TROPONINI" in the last 168 hours. Recent Labs    07/14/23 1852  LABPROT 13.6  INR 1.0   No results for input(s): "COLORURINE", "LABSPEC", "PHURINE", "GLUCOSEU", "HGBUR", "BILIRUBINUR", "KETONESUR", "PROTEINUR", "UROBILINOGEN", "NITRITE", "LEUKOCYTESUR" in the last 72 hours.  Invalid input(s): "APPERANCEUR"     Component Value Date/Time   CHOL 156 07/15/2023 0503   TRIG 101 07/15/2023 0503   HDL 40 (L) 07/15/2023 0503   CHOLHDL 3.9 07/15/2023 0503   VLDL 20 07/15/2023 0503   LDLCALC 96 07/15/2023 0503   Lab Results  Component Value Date   HGBA1C 5.5 06/19/2019      Component Value Date/Time   LABOPIA NONE DETECTED 07/14/2023 1942    COCAINSCRNUR NONE DETECTED 07/14/2023 1942   LABBENZ NONE DETECTED 07/14/2023 1942   AMPHETMU NONE DETECTED 07/14/2023 1942   THCU NONE DETECTED 07/14/2023 1942   LABBARB NONE DETECTED 07/14/2023 1942    Recent Labs  Lab 07/14/23 1852  ETH <15    I have personally reviewed the radiological images below and agree with the radiology interpretations.  MR BRAIN WO CONTRAST Result Date: 07/15/2023 CLINICAL DATA:  68 year old female code stroke presentation yesterday. EXAM: MRI HEAD WITHOUT CONTRAST TECHNIQUE: Multiplanar, multiecho pulse sequences of the brain and surrounding structures were obtained without intravenous contrast. COMPARISON:  CT head, CTA head and neck yesterday. FINDINGS: Brain: Multifocal patchy and confluent restricted diffusion in the right hemisphere, ranging from small areas of cortical and white matter involvement at the right insula to more confluent involvement along the right posterior operculum, 2 a 4 cm area of confluent involvement at the anterior right occipital lobe from the periventricular white matter to the lateral occipital cortex (series 3, image 29). T2 and FLAIR hyperintense cytotoxic edema in the affected areas. No convincing hemorrhagic transformation. No significant intracranial mass effect. No other diffusion restriction. No midline shift, mass effect, evidence of mass lesion, ventriculomegaly, extra-axial collection or acute intracranial hemorrhage. Cervicomedullary junction and pituitary are within  normal limits. Scattered mild for age nonspecific cerebral white matter T2 and FLAIR hyperintensity elsewhere. No chronic cortical encephalomalacia or chronic cerebral blood products identified. Small chronic lacunar infarct of the right caudate nucleus. Deep gray nuclei, brainstem, cerebellum otherwise appear negative. Vascular: Major intracranial vascular flow voids are preserved, dominant distal right vertebral artery as seen by CTA. Skull and upper cervical spine:  Normal for age. Visualized bone marrow signal is within normal limits. Sinuses/Orbits: Negative. Paranasal Visualized paranasal sinuses and mastoids are stable and well aerated. Other: Visible internal auditory structures appear normal. IMPRESSION: 1. Multifocal Acute infarcts scattered in the middle Right MCA division, at the Right MCA/PCA watershed area. No hemorrhagic transformation or mass effect. 2. Mild for age underlying white matter disease, small chronic lacunar infarct in the right caudate nucleus. Electronically Signed   By: Marlise Simpers M.D.   On: 07/15/2023 09:58   CT ANGIO HEAD NECK W WO CM Result Date: 07/14/2023 CLINICAL DATA:  Acute ischemic attack EXAM: CT ANGIOGRAPHY HEAD AND NECK WITH AND WITHOUT CONTRAST TECHNIQUE: Multidetector CT imaging of the head and neck was performed using the standard protocol during bolus administration of intravenous contrast. Multiplanar CT image reconstructions and MIPs were obtained to evaluate the vascular anatomy. Carotid stenosis measurements (when applicable) are obtained utilizing NASCET criteria, using the distal internal carotid diameter as the denominator. RADIATION DOSE REDUCTION: This exam was performed according to the departmental dose-optimization program which includes automated exposure control, adjustment of the mA and/or kV according to patient size and/or use of iterative reconstruction technique. CONTRAST:  75mL OMNIPAQUE  IOHEXOL  350 MG/ML SOLN COMPARISON:  None Available. FINDINGS: CTA NECK FINDINGS Skeleton: No acute abnormality or high grade bony spinal canal stenosis. Other neck: Normal pharynx, larynx and major salivary glands. No cervical lymphadenopathy. Unremarkable thyroid gland. Upper chest: No pneumothorax or pleural effusion. No nodules or masses. Aortic arch: There is no calcific atherosclerosis of the aortic arch. Conventional 3 vessel aortic branching pattern. RIGHT carotid system: The common carotid artery and carotid bifurcation are  normal. There is focal fusiform dilatation of the distal ICA just proximal to the skull base with a maximum diameter of 10 mm. LEFT carotid system: The common carotid artery and carotid bifurcation are normal. The distal ICA is extremely tortuous with dilatation measuring up to 11 mm. There is a fenestration or dissection flap (5:143 and 8:121). Vertebral arteries: Right dominant configuration. Minimal atherosclerosis of both vertebral arteries are widely patent. CTA HEAD FINDINGS POSTERIOR CIRCULATION: Normal vertebral artery V4 segments. No proximal occlusion of the anterior or inferior cerebellar arteries. Basilar artery is normal. Superior cerebellar arteries are normal. Posterior cerebral arteries are normal. ANTERIOR CIRCULATION: Intracranial internal carotid arteries are normal. Anterior cerebral arteries are normal. Middle cerebral arteries are normal. Venous sinuses: As permitted by contrast timing, patent. Anatomic variants: Patent right P-comm Review of the MIP images confirms the above findings. IMPRESSION: 1. No emergent large vessel occlusion or high-grade stenosis of the intracranial arteries. 2. Fusiform dilatation of the distal left ICA with a fenestration or dissection flap. 3. Focal fusiform dilatation of the distal right ICA just proximal to the skull base with a maximum diameter of 10 mm. 4. No hemodynamically significant stenosis of the cervical carotid or vertebral arteries. Electronically Signed   By: Juanetta Nordmann M.D.   On: 07/14/2023 20:40   CT HEAD CODE STROKE WO CONTRAST Result Date: 07/14/2023 CLINICAL DATA:  Code stroke. Neuro deficit, concern for stroke, right-sided facial droop, confusion, slurred speech. EXAM: CT HEAD  WITHOUT CONTRAST TECHNIQUE: Contiguous axial images were obtained from the base of the skull through the vertex without intravenous contrast. RADIATION DOSE REDUCTION: This exam was performed according to the departmental dose-optimization program which includes  automated exposure control, adjustment of the mA and/or kV according to patient size and/or use of iterative reconstruction technique. COMPARISON:  None Available. FINDINGS: Brain: No acute intracranial hemorrhage. Hypoattenuation and loss of gray-white differentiation in the posterior aspect of the right cerebral hemisphere involving the posterior right temporal lobe, lateral right occipital lobe and a portion of the right inferior parietal lobule. Nonspecific hypoattenuation in the periventricular and subcortical white matter favored to reflect chronic microvascular ischemic changes. No significant mass effect or midline shift. The basilar cisterns are patent. Posterior fossa is unremarkable. Ventricles: The ventricles are normal. Vascular: No hyperdense vessel or unexpected calcification. Skull: No acute or aggressive finding. Orbits: Orbits are symmetric. Sinuses: The visualized paranasal sinuses are clear. Other: Mastoid air cells are clear. ASPECTS [facility] Stroke Program Early CT Score) - Ganglionic level infarction (caudate, lentiform nuclei, internal capsule, insula, M1-M3 cortex): 6 - Supraganglionic infarction (M4-M6 cortex): 3 Total score (0-10 with 10 being normal): 9 IMPRESSION: 1. Focal hypoattenuation and loss of gray-white differentiation in the posterior aspect of the right MCA territory concerning for acute infarct. 2. No acute intracranial hemorrhage. 3. Mild chronic microvascular ischemic changes. 4. ASPECTS is 9 These results were communicated to Dr. Doretta Gant At 6:47 pm on 07/14/2023 by text page via the [facility] messaging system. Electronically Signed   By: Denny Flack M.D.   On: 07/14/2023 18:47     PHYSICAL EXAM  Temp:  [97.7 F (36.5 C)-98.7 F (37.1 C)] 98 F (36.7 C) (06/08 1408) Pulse Rate:  [70-100] 81 (06/08 1400) Resp:  [11-24] 22 (06/08 1400) BP: (116-190)/(61-125) 123/98 (06/08 1400) SpO2:  [89 %-100 %] 96 % (06/08 1400) Weight:  [83.8 kg] 83.8 kg (06/07 1846)  General -  Well nourished, well developed, in no apparent distress.  Ophthalmologic - fundi not visualized due to noncooperation.  Cardiovascular - Regular rhythm and rate.  Neuro - awake, alert, eyes open, told me age 63 instead of 60, told me month was May but self corrected to June after questioning. Mild expressive aphasia with frequent paraphasic error and hesitancy of speech. Able to name and repeat and read but with frequent paraphasic errors. No gaze palsy, tracking bilaterally, visual field full but decreased visual acuity on the left visual field and with left visual field simultagnosia, PERRL. No facial droop. Tongue midline. Bilateral UEs 5/5, no drift. Bilaterally LEs 5/5, no drift. Sensation symmetrical bilaterally, b/l FTN intact, gait not tested.   ASSESSMENT/PLAN Kathryn Cameron is a left-handed 68 y.o. female with history of hypertension admitted for confusion, speech difficulty. No TNK given due to outside window.    Stroke:  right MCA multifocal infarct likely embolic secondary to cardioembolic source, concerning for undiagnosed A-fib CT right posterior MCA infarct CT head and neck left ICA distal fenestration versus dissection  MRI  Multifocal Acute infarcts scattered in the middle Right MCA division, at the Right MCA/PCA watershed area.  2D Echo pending LE venous Doppler pending Recommend loop recorder prior to discharge to rule out A-fib LDL 96 HgbA1c pending UDS negative Lovenox  for VTE prophylaxis No antithrombotic prior to admission, now on aspirin 81 mg daily and clopidogrel 75 mg daily DAPT for 3 weeks and then as alone. Patient counseled to be compliant with her antithrombotic medications Ongoing aggressive stroke risk  factor management Therapy recommendations: Pending Disposition: Pending  Heart palpitation One month ago, pt woke up from sleep had heart palpitation, irregular heartbeat and heart rate 160s, lasted about several hours.  She talked with her PCP and was  told " sometimes it happens" 2-3 days ago, patient again woke up with sleep had heart palpitation, but heart rate only 120s, however it lasted all day long. Given current embolic stroke, concerning for undiagnosed A-fib, recommend loop recorder prior to discharge to rule out A-fib.  Fusiform aneurysms, chronic CT head and neck Fusiform dilatation of the distal left ICA with a fenestration or dissection flap. Focal fusiform dilatation of the distal right ICA just proximal to the skull base with a maximum diameter of 10 mm. Likely chronic IR consulted by primary team Plan for diagnostic cerebral angiogram  Hypertension Stable Home meds including amlodipine  2.5, lisinopril 20, HCTZ 12.5 Long term BP goal normotensive  Hyperlipidemia Home meds: None LDL 96, goal < 70 Now on Lipitor 40 Continue statin at discharge  Other Stroke Risk Factors Advanced age Obesity, Body mass index is 32.73 kg/m.   Other Active Problems None  Hospital day # 1    Consuelo Denmark, MD PhD Stroke Neurology 07/15/2023 3:09 PM    To contact Stroke Continuity provider, please refer to [email or web address]. After hours, contact General Neurology

## 2023-07-15 NOTE — Assessment & Plan Note (Signed)
-   Permissive hypertension.  Goal BP less than 180/105

## 2023-07-15 NOTE — Hospital Course (Signed)
 Kathryn Cameron is a 68 yo female with PMH HTN, OSA, B12 deficiency, vitamin D deficiency who presented after developing slurred speech and confusion.  There was also report of left-sided facial droop left-sided numbness. She was admitted for stroke workup. MRI brain showed multiple acute infarcts involving right MCA.  CT angio head/neck showed bilateral ICA fusiform dilation. Patient had also mentioned feeling elevated heart rate and palpitations over the past couple months.  She states her rate was as fast as in the 160s when she measured it at home.  She has no known history of arrhythmia or cardiac issues otherwise. She was admitted for ongoing stroke workup.

## 2023-07-16 ENCOUNTER — Inpatient Hospital Stay (HOSPITAL_COMMUNITY)

## 2023-07-16 DIAGNOSIS — I6389 Other cerebral infarction: Secondary | ICD-10-CM

## 2023-07-16 DIAGNOSIS — I639 Cerebral infarction, unspecified: Secondary | ICD-10-CM

## 2023-07-16 DIAGNOSIS — I63411 Cerebral infarction due to embolism of right middle cerebral artery: Secondary | ICD-10-CM | POA: Diagnosis not present

## 2023-07-16 DIAGNOSIS — I10 Essential (primary) hypertension: Secondary | ICD-10-CM | POA: Diagnosis not present

## 2023-07-16 DIAGNOSIS — E785 Hyperlipidemia, unspecified: Secondary | ICD-10-CM | POA: Diagnosis not present

## 2023-07-16 DIAGNOSIS — R002 Palpitations: Secondary | ICD-10-CM | POA: Diagnosis not present

## 2023-07-16 LAB — ECHOCARDIOGRAM COMPLETE BUBBLE STUDY
AR max vel: 3.42 cm2
AV Area VTI: 4 cm2
AV Area mean vel: 3.44 cm2
AV Mean grad: 8 mmHg
AV Peak grad: 14.6 mmHg
Ao pk vel: 1.91 m/s
Area-P 1/2: 3.08 cm2
Calc EF: 67.9 %
S' Lateral: 3.5 cm
Single Plane A2C EF: 77.2 %
Single Plane A4C EF: 61.5 %

## 2023-07-16 LAB — HEMOGLOBIN A1C
Hgb A1c MFr Bld: 5.3 % (ref 4.8–5.6)
Mean Plasma Glucose: 105 mg/dL

## 2023-07-16 LAB — GLUCOSE, CAPILLARY: Glucose-Capillary: 108 mg/dL — ABNORMAL HIGH (ref 70–99)

## 2023-07-16 NOTE — Progress Notes (Signed)
 PROGRESS NOTE        PATIENT DETAILS Name: Kathryn Cameron Age: 68 y.o. Sex: female Date of Birth: 1955/12/16 Admit Date: 07/14/2023 Admitting Physician Jetty Mort, MD [id number], Claudette Cue, PA-C  Brief Summary: Patient is a 68 y.o.  female with history of HTN, OSA who presented with slurred speech/confusion-found to have acute CVA.  Significant events: 6/7>> acute CVA-admit to [facility].  Significant studies: 6/7>> MRI brain: Multifocal acute infarcts in the middle right MCA territory. 6/7>> CTA head/neck: No LV/high-grade stenosis.  Fusiform dilatation of distal left ICA with fenestration or dissection flap.  Effusion formed dilatation of distal right ICA. 6/7>> UTI: Negative 6/ 8>> LDL: 96 6/8>> A1c: Pending  Significant microbiology data: None  Procedures: None  Consults: Neurology IR  Subjective: Lying comfortably in bed-denies any chest pain or shortness of breath.  Speech is markedly better.  Following all commands.  Objective: Vitals: Blood pressure (!) 112/59, pulse 68, temperature 98.4 F (36.9 C), temperature source Oral, resp. rate (!) 21, height 5\' 3"  (1.6 m), weight 83.8 kg, SpO2 93%.   Exam: Gen Exam:Alert awake-not in any distress HEENT:atraumatic, normocephalic Chest: B/L clear to auscultation anteriorly CVS:S1S2 regular Abdomen:soft non tender, non distended Extremities:no edema Neurology: Non focal Skin: no rash  Pertinent Labs/Radiology:    Latest Ref Rng & Units 07/15/2023    5:03 AM 07/14/2023    6:52 PM 07/14/2023    6:21 PM  CBC  WBC 4.0 - 10.5 K/uL 6.5  8.6    Hemoglobin 12.0 - 15.0 g/dL 29.5  28.4  13.2   Hematocrit 36.0 - 46.0 % 37.6  39.5  39.0   Platelets 150 - 400 K/uL 297  319      Lab Results  Component Value Date   NA 138 07/15/2023   K 3.5 07/15/2023   CL 105 07/15/2023   CO2 24 07/15/2023     Assessment/Plan: Acute CVA Felt to be cardioembolic Telemetry negative Nonfocal exam-speech is  significantly better Workup as above Awaiting echo/lower extremity Dopplers Neurology recommending aspirin/Plavix x 3 weeks then aspirin alone, remains on high intensity statin Neurology recommending loop recorder prior to discharge.  B/L ICA fusiform aneurysm IR following for diagnostic cerebral angiogram-later today.  History of palpitations Telemetry negative overnight Recent TSH at outside hospital on January 2025-stable. Loop recorder planned prior to discharge.  HLD Continue Lipitor 40 mg daily on discharge.  HTN BP stable-permissive hypertension  All antihypertensives on hold-resume when able.  Class 1 Obesity: Estimated body mass index is 32.73 kg/m as calculated from the following:   Height as of this encounter: 5\' 3"  (1.6 m).   Weight as of this encounter: 83.8 kg.   Code status:   Code Status: Full Code   DVT Prophylaxis: enoxaparin  (LOVENOX ) injection 40 mg Start: 07/14/23 2000   Family Communication: None at bedside   Disposition Plan: Status is: Inpatient Remains inpatient appropriate because: Severity of illness   Planned Discharge Destination:Home health versus SNF   Diet: Diet Order             Diet NPO time specified Except for: Sips with Meds  Diet effective midnight                     Antimicrobial agents: Anti-infectives (From admission, onward)    None  MEDICATIONS: Scheduled Meds:  aspirin EC  81 mg Oral Daily   atorvastatin  40 mg Oral Daily   clopidogrel  75 mg Oral Daily   enoxaparin  (LOVENOX ) injection  40 mg Subcutaneous Q24H   pantoprazole   40 mg Oral Daily   sodium chloride flush  3-10 mL Intravenous Q12H   Continuous Infusions: PRN Meds:.acetaminophen  **OR** acetaminophen  (TYLENOL ) oral liquid 160 mg/5 mL **OR** acetaminophen , metoCLOPramide (REGLAN) injection **AND** diphenhydrAMINE, hydrALAZINE , senna-docusate, sodium chloride flush   I have personally reviewed following labs and imaging  studies  LABORATORY DATA: CBC: Recent Labs  Lab 07/14/23 1821 07/14/23 1852 07/15/23 0503  WBC  --  8.6 6.5  NEUTROABS  --  6.0  --   HGB 13.3 13.5 12.6  HCT 39.0 39.5 37.6  MCV  --  88.0 87.9  PLT  --  319 297    Basic Metabolic Panel: Recent Labs  Lab 07/14/23 1821 07/14/23 1852 07/15/23 0503  NA 138 138 138  K 3.8 3.8 3.5  CL 104 105 105  CO2  --  21* 24  GLUCOSE 109* 108* 95  BUN 23 23 18   CREATININE 0.90 0.84 0.88  CALCIUM  --  9.5 9.2    GFR: Estimated Creatinine Clearance: 62.8 mL/min (by C-G formula based on SCr of 0.88 mg/dL).  Liver Function Tests: Recent Labs  Lab 07/14/23 1852 07/15/23 0503  AST 19 19  ALT 19 17  ALKPHOS 89 84  BILITOT 0.8 0.8  PROT 6.5 5.9*  ALBUMIN 3.8 3.4*   No results for input(s): "LIPASE", "AMYLASE" in the last 168 hours. No results for input(s): "AMMONIA" in the last 168 hours.  Coagulation Profile: Recent Labs  Lab 07/14/23 1852  INR 1.0    Cardiac Enzymes: No results for input(s): "CKTOTAL", "CKMB", "CKMBINDEX", "TROPONINI" in the last 168 hours.  BNP (last 3 results) No results for input(s): "PROBNP" in the last 8760 hours.  Lipid Profile: Recent Labs    07/15/23 0503  CHOL 156  HDL 40*  LDLCALC 96  TRIG 161  CHOLHDL 3.9    Thyroid Function Tests: No results for input(s): "TSH", "T4TOTAL", "FREET4", "T3FREE", "THYROIDAB" in the last 72 hours.  Anemia Panel: No results for input(s): "VITAMINB12", "FOLATE", "FERRITIN", "TIBC", "IRON", "RETICCTPCT" in the last 72 hours.  Urine analysis:    Component Value Date/Time   COLORURINE YELLOW 06/09/2019 0210   APPEARANCEUR CLEAR 06/09/2019 0210   LABSPEC 1.015 06/09/2019 0210   PHURINE 5.5 06/09/2019 0210   GLUCOSEU NEGATIVE 06/09/2019 0210   HGBUR NEGATIVE 06/09/2019 0210   BILIRUBINUR NEGATIVE 06/09/2019 0210   KETONESUR NEGATIVE 06/09/2019 0210   PROTEINUR NEGATIVE 06/09/2019 0210   NITRITE NEGATIVE 06/09/2019 0210   LEUKOCYTESUR SMALL (A)  06/09/2019 0210    Sepsis Labs: Lactic Acid, Venous    Component Value Date/Time   LATICACIDVEN 1.0 06/09/2019 0616    MICROBIOLOGY: No results found for this or any previous visit (from the past 240 hours).  RADIOLOGY STUDIES/RESULTS: MR BRAIN WO CONTRAST Result Date: 07/15/2023 CLINICAL DATA:  68 year old female code stroke presentation yesterday. EXAM: MRI HEAD WITHOUT CONTRAST TECHNIQUE: Multiplanar, multiecho pulse sequences of the brain and surrounding structures were obtained without intravenous contrast. COMPARISON:  CT head, CTA head and neck yesterday. FINDINGS: Brain: Multifocal patchy and confluent restricted diffusion in the right hemisphere, ranging from small areas of cortical and white matter involvement at the right insula to more confluent involvement along the right posterior operculum, 2 a 4 cm area of confluent involvement at  the anterior right occipital lobe from the periventricular white matter to the lateral occipital cortex (series 3, image 29). T2 and FLAIR hyperintense cytotoxic edema in the affected areas. No convincing hemorrhagic transformation. No significant intracranial mass effect. No other diffusion restriction. No midline shift, mass effect, evidence of mass lesion, ventriculomegaly, extra-axial collection or acute intracranial hemorrhage. Cervicomedullary junction and pituitary are within normal limits. Scattered mild for age nonspecific cerebral white matter T2 and FLAIR hyperintensity elsewhere. No chronic cortical encephalomalacia or chronic cerebral blood products identified. Small chronic lacunar infarct of the right caudate nucleus. Deep gray nuclei, brainstem, cerebellum otherwise appear negative. Vascular: Major intracranial vascular flow voids are preserved, dominant distal right vertebral artery as seen by CTA. Skull and upper cervical spine: Normal for age. Visualized bone marrow signal is within normal limits. Sinuses/Orbits: Negative. Paranasal  Visualized paranasal sinuses and mastoids are stable and well aerated. Other: Visible internal auditory structures appear normal. IMPRESSION: 1. Multifocal Acute infarcts scattered in the middle Right MCA division, at the Right MCA/PCA watershed area. No hemorrhagic transformation or mass effect. 2. Mild for age underlying white matter disease, small chronic lacunar infarct in the right caudate nucleus. Electronically Signed   By: Marlise Simpers M.D.   On: 07/15/2023 09:58   CT ANGIO HEAD NECK W WO CM Result Date: 07/14/2023 CLINICAL DATA:  Acute ischemic attack EXAM: CT ANGIOGRAPHY HEAD AND NECK WITH AND WITHOUT CONTRAST TECHNIQUE: Multidetector CT imaging of the head and neck was performed using the standard protocol during bolus administration of intravenous contrast. Multiplanar CT image reconstructions and MIPs were obtained to evaluate the vascular anatomy. Carotid stenosis measurements (when applicable) are obtained utilizing NASCET criteria, using the distal internal carotid diameter as the denominator. RADIATION DOSE REDUCTION: This exam was performed according to the departmental dose-optimization program which includes automated exposure control, adjustment of the mA and/or kV according to patient size and/or use of iterative reconstruction technique. CONTRAST:  75mL OMNIPAQUE  IOHEXOL  350 MG/ML SOLN COMPARISON:  None Available. FINDINGS: CTA NECK FINDINGS Skeleton: No acute abnormality or high grade bony spinal canal stenosis. Other neck: Normal pharynx, larynx and major salivary glands. No cervical lymphadenopathy. Unremarkable thyroid gland. Upper chest: No pneumothorax or pleural effusion. No nodules or masses. Aortic arch: There is no calcific atherosclerosis of the aortic arch. Conventional 3 vessel aortic branching pattern. RIGHT carotid system: The common carotid artery and carotid bifurcation are normal. There is focal fusiform dilatation of the distal ICA just proximal to the skull base with a maximum  diameter of 10 mm. LEFT carotid system: The common carotid artery and carotid bifurcation are normal. The distal ICA is extremely tortuous with dilatation measuring up to 11 mm. There is a fenestration or dissection flap (5:143 and 8:121). Vertebral arteries: Right dominant configuration. Minimal atherosclerosis of both vertebral arteries are widely patent. CTA HEAD FINDINGS POSTERIOR CIRCULATION: Normal vertebral artery V4 segments. No proximal occlusion of the anterior or inferior cerebellar arteries. Basilar artery is normal. Superior cerebellar arteries are normal. Posterior cerebral arteries are normal. ANTERIOR CIRCULATION: Intracranial internal carotid arteries are normal. Anterior cerebral arteries are normal. Middle cerebral arteries are normal. Venous sinuses: As permitted by contrast timing, patent. Anatomic variants: Patent right P-comm Review of the MIP images confirms the above findings. IMPRESSION: 1. No emergent large vessel occlusion or high-grade stenosis of the intracranial arteries. 2. Fusiform dilatation of the distal left ICA with a fenestration or dissection flap. 3. Focal fusiform dilatation of the distal right ICA just proximal to the skull  base with a maximum diameter of 10 mm. 4. No hemodynamically significant stenosis of the cervical carotid or vertebral arteries. Electronically Signed   By: Juanetta Nordmann M.D.   On: 07/14/2023 20:40   CT HEAD CODE STROKE WO CONTRAST Result Date: 07/14/2023 CLINICAL DATA:  Code stroke. Neuro deficit, concern for stroke, right-sided facial droop, confusion, slurred speech. EXAM: CT HEAD WITHOUT CONTRAST TECHNIQUE: Contiguous axial images were obtained from the base of the skull through the vertex without intravenous contrast. RADIATION DOSE REDUCTION: This exam was performed according to the departmental dose-optimization program which includes automated exposure control, adjustment of the mA and/or kV according to patient size and/or use of iterative  reconstruction technique. COMPARISON:  None Available. FINDINGS: Brain: No acute intracranial hemorrhage. Hypoattenuation and loss of gray-white differentiation in the posterior aspect of the right cerebral hemisphere involving the posterior right temporal lobe, lateral right occipital lobe and a portion of the right inferior parietal lobule. Nonspecific hypoattenuation in the periventricular and subcortical white matter favored to reflect chronic microvascular ischemic changes. No significant mass effect or midline shift. The basilar cisterns are patent. Posterior fossa is unremarkable. Ventricles: The ventricles are normal. Vascular: No hyperdense vessel or unexpected calcification. Skull: No acute or aggressive finding. Orbits: Orbits are symmetric. Sinuses: The visualized paranasal sinuses are clear. Other: Mastoid air cells are clear. ASPECTS [facility] Stroke Program Early CT Score) - Ganglionic level infarction (caudate, lentiform nuclei, internal capsule, insula, M1-M3 cortex): 6 - Supraganglionic infarction (M4-M6 cortex): 3 Total score (0-10 with 10 being normal): 9 IMPRESSION: 1. Focal hypoattenuation and loss of gray-white differentiation in the posterior aspect of the right MCA territory concerning for acute infarct. 2. No acute intracranial hemorrhage. 3. Mild chronic microvascular ischemic changes. 4. ASPECTS is 9 These results were communicated to Dr. Doretta Gant At 6:47 pm on 07/14/2023 by text page via the [facility] messaging system. Electronically Signed   By: Denny Flack M.D.   On: 07/14/2023 18:47     LOS: 2 days   Kimberly Penna, MD  [facility]    To contact the attending provider between 7A-7P or the covering provider during after hours 7P-7A, please log into the web site [email or web address] and access using universal [place] password for that web site. If you do not have the password, please call the hospital operator.  07/16/2023, 8:10 AM

## 2023-07-16 NOTE — Evaluation (Signed)
 Physical Therapy Evaluation Patient Details Name: Kathryn Cameron MRN: [id number] DOB: 13-Jun-1955 Today's Date: 07/16/2023  History of Present Illness  Pt is a 68 y/o female presenting with slurred speech. MRI brain showed multifocal acute infarcts scattered in R MCA division. PMH: prior hernia repair, HTN, obstructive sleep apnea, vitamin B12 deficiency and vitamin D deficiency.  Clinical Impression  Kathryn Cameron is 68 y.o. female admitted with above HPI and diagnosis. Patient is currently limited by functional impairments below (see PT problem list). Patient lives alone and is independent at baseline. Currently pt in completing bed mob and transfers at mod ind/ind level taking slightly extra time. Pt amb ~500" today with no overt LOB, fair pace, and good ability to complete dynamic challenges. Pt scored a 21/24 on the DGI; a score of </= 19/24 is predictive of falls in the elderly, but a score of > 22/24 is indicative of safe ambulators. While not a high fall risk pt does have some mild balance deficits. Patient will benefit from continued skilled PT interventions to address impairments and progress independence with mobility. Acute PT will follow and progress as able.         If plan is discharge home, recommend the following: Assist for transportation   Can travel by private vehicle        Equipment Recommendations None recommended by PT  Recommendations for Other Services       Functional Status Assessment Patient has had a recent decline in their functional status and demonstrates the ability to make significant improvements in function in a reasonable and predictable amount of time.     Precautions / Restrictions Precautions Precautions: Other (comment) Recall of Precautions/Restrictions: Intact Precaution/Restrictions Comments: low fall risk Restrictions Weight Bearing Restrictions Per Provider Order: No      Mobility  Bed Mobility Overal bed mobility: Modified  Independent             General bed mobility comments: time and HOB slightly elevated during supine<>sit EOB.    Transfers Overall transfer level: Modified independent Equipment used: None               General transfer comment: use of hands for rise and lower    Ambulation/Gait Ambulation/Gait assistance: Supervision Gait Distance (Feet): 500 Feet Assistive device: None Gait Pattern/deviations: Step-through pattern, Decreased stride length, WFL(Within Functional Limits) Gait velocity: fair     General Gait Details: no overt LOB, fair pace with no DME. DGI completed and mild deficits noted.  VSS.  Stairs            Wheelchair Mobility     Tilt Bed    Modified Rankin (Stroke Patients Only)       Balance Overall balance assessment: Needs assistance Sitting-balance support: Feet supported, No upper extremity supported Sitting balance-Leahy Scale: Good     Standing balance support: During functional activity, No upper extremity supported Standing balance-Leahy Scale: Good                   Standardized Balance Assessment Standardized Balance Assessment : Dynamic Gait Index   Dynamic Gait Index Level Surface: Normal Change in Gait Speed: Normal Gait with Horizontal Head Turns: Mild Impairment Gait with Vertical Head Turns: Normal Gait and Pivot Turn: Mild Impairment Step Over Obstacle: Normal Step Around Obstacles: Normal Steps: Mild Impairment Total Score: 21       Pertinent Vitals/Pain Pain Assessment Pain Assessment: Faces Faces Pain Scale: Hurts a little bit Pain Location: headache Pain Descriptors /  Indicators: Headache Pain Intervention(s): Limited activity within patient's tolerance, Monitored during session, Repositioned    Home Living Family/patient expects to be discharged to:: Private residence Living Arrangements: Alone Available Help at Discharge: Family;Available PRN/intermittently Type of Home: House Home  Access: Level entry       Home Layout: One level Home Equipment: None Additional Comments: retired NT from [employer]    Prior Function Prior Level of Function : Independent/Modified Independent;Driving             Mobility Comments: no AD ADLs Comments: Indep with ADLs, iADLs, driving     Extremity/Trunk Assessment   Upper Extremity Assessment Upper Extremity Assessment: Defer to OT evaluation    Lower Extremity Assessment Lower Extremity Assessment: Defer to PT evaluation    Cervical / Trunk Assessment Cervical / Trunk Assessment: Normal  Communication   Communication Communication: Impaired Factors Affecting Communication: Difficulty expressing self;Reduced clarity of speech    Cognition Arousal: Alert Behavior During Therapy: WFL for tasks assessed/performed   PT - Cognitive impairments: No apparent impairments                         Following commands: Intact       Cueing Cueing Techniques: Verbal cues     General Comments      Exercises     Assessment/Plan    PT Assessment Patient needs continued PT services  PT Problem List Decreased activity tolerance;Decreased balance;Decreased mobility;Decreased cognition;Cardiopulmonary status limiting activity;Decreased safety awareness;Decreased knowledge of use of DME       PT Treatment Interventions DME instruction;Gait training;Stair training;Functional mobility training;Therapeutic activities;Therapeutic exercise;Balance training;Neuromuscular re-education;Patient/family education    PT Goals (Current goals can be found in the Care Plan section)  Acute Rehab PT Goals Patient Stated Goal: get back to normal independent self PT Goal Formulation: With patient Time For Goal Achievement: 07/30/23 Potential to Achieve Goals: Good    Frequency Min 2X/week     Co-evaluation               AM-PAC PT "6 Clicks" Mobility  Outcome Measure Help needed turning from your back to your side  while in a flat bed without using bedrails?: None Help needed moving from lying on your back to sitting on the side of a flat bed without using bedrails?: None Help needed moving to and from a bed to a chair (including a wheelchair)?: None Help needed standing up from a chair using your arms (e.g., wheelchair or bedside chair)?: None Help needed to walk in hospital room?: A Little Help needed climbing 3-5 steps with a railing? : A Little 6 Click Score: 22    End of Session Equipment Utilized During Treatment: Gait belt Activity Tolerance: Patient tolerated treatment well Patient left: in bed;with call bell/phone within reach;Other (comment) (vesc ultrasound at bedside) Nurse Communication: Mobility status PT Visit Diagnosis: Unsteadiness on feet (R26.81);Muscle weakness (generalized) (M62.81);Difficulty in walking, not elsewhere classified (R26.2)    Time: 1191-4782 PT Time Calculation (min) (ACUTE ONLY): 25 min   Charges:   PT Evaluation $PT Eval Moderate Complexity: 1 Mod PT Treatments $Gait Training: 8-22 mins PT General Charges $$ ACUTE PT VISIT: 1 Visit         Tish Forge, DPT Acute Rehabilitation Services Office [phone]  07/16/23 4:00 PM

## 2023-07-16 NOTE — TOC CAGE-AID Note (Signed)
 Transition of Care [facility]) - CAGE-AID Screening   Patient Details  Name: Kathryn Cameron MRN: [id number] Date of Birth: 12/20/1955  Transition of Care [facility]) CM/SW Contact:    Odell Choung E Kahli Fitzgerald, LCSW Phone Number: 07/16/2023, 11:32 AM   Clinical Narrative:    CAGE-AID Screening:    Have You Ever Felt You Ought to Cut Down on Your Drinking or Drug Use?: No Have People Annoyed You By [employer] Your Drinking Or Drug Use?: No Have You Felt Bad Or Guilty About Your Drinking Or Drug Use?: No Have You Ever Had a Drink or Used Drugs First Thing In The Morning to Steady Your Nerves or to Get Rid of a Hangover?: No CAGE-AID Score: 0  Substance Abuse Education Offered: No

## 2023-07-16 NOTE — Evaluation (Signed)
 Occupational Therapy Evaluation Patient Details Name: Kathryn Cameron MRN: [id number] DOB: 03-24-1955 Today's Date: 07/16/2023   History of Present Illness   Pt is a 68 y/o female presenting with slurred speech. MRI brain showed multifocal acute infarcts scattered in R MCA division. PMH: prior hernia repair, HTN, obstructive sleep apnea, vitamin B12 deficiency and vitamin D deficiency.     Clinical Impressions PTA, pt lives alone, typically completely independent in all ADLs, IADLs, driving and mobility without AD. Pt presents now with deficits in speech though pt aware and able to self correct majority of the time. Otherwise, pt presenting near baseline- able to manage ADLs and mobilize in hallway without AD w/ no more than Supervision. Pt aware of stroke signs/symptoms and aware of importance of checking vitals at home. Pt functionally appropriate for DC home w/o need for OT follow up.      If plan is discharge home, recommend the following:   Other (comment) (PRN)     Functional Status Assessment   Patient has had a recent decline in their functional status and demonstrates the ability to make significant improvements in function in a reasonable and predictable amount of time.     Equipment Recommendations   None recommended by OT     Recommendations for Other Services         Precautions/Restrictions   Precautions Precautions: Other (comment) Recall of Precautions/Restrictions: Intact Precaution/Restrictions Comments: low fall risk Restrictions Weight Bearing Restrictions Per Provider Order: No     Mobility Bed Mobility Overal bed mobility: Modified Independent                  Transfers Overall transfer level: Independent Equipment used: None                      Balance Overall balance assessment: Needs assistance Sitting-balance support: Feet supported, No upper extremity supported Sitting balance-Leahy Scale: Good     Standing  balance support: During functional activity, No upper extremity supported Standing balance-Leahy Scale: Good                             ADL either performed or assessed with clinical judgement   ADL Overall ADL's : Needs assistance/impaired Eating/Feeding: NPO Eating/Feeding Details (indicate cue type and reason): anticipate no issues though Grooming: Modified independent;Standing   Upper Body Bathing: Modified independent   Lower Body Bathing: Supervison/ safety   Upper Body Dressing : Modified independent   Lower Body Dressing: Supervision/safety   Toilet Transfer: Supervision/safety;Ambulation   Toileting- Clothing Manipulation and Hygiene: Supervision/safety       Functional mobility during ADLs: Supervision/safety General ADL Comments: able to mobilize on unit without AD, able to self correct majority of speech issues. Aware of stroke symptoms, checking vitals at home and modification of community IADLs as needed (self checkout if speech too off to talk to cashier)     Vision Ability to See in Adequate Light: 0 Adequate Patient Visual Report: No change from baseline Vision Assessment?: No apparent visual deficits Additional Comments: appearance of B ptosis but vision [facility]     Perception         Praxis         Pertinent Vitals/Pain Pain Assessment Pain Assessment: Faces Faces Pain Scale: Hurts a little bit Pain Location: headache Pain Descriptors / Indicators: Headache Pain Intervention(s): Monitored during session     Extremity/Trunk Assessment Upper Extremity Assessment Upper Extremity Assessment:  Overall [facility] for tasks assessed;Right hand dominant   Lower Extremity Assessment Lower Extremity Assessment: Defer to PT evaluation   Cervical / Trunk Assessment Cervical / Trunk Assessment: Normal   Communication Communication Communication: Impaired Factors Affecting Communication: Difficulty expressing self;Reduced clarity of speech    Cognition Arousal: Alert Behavior During Therapy: WFL for tasks assessed/performed Cognition: No apparent impairments             OT - Cognition Comments: speech changes but aware and able to self correct.                 Following commands: Intact       Cueing  General Comments   Cueing Techniques: Verbal cues      Exercises     Shoulder Instructions      Home Living Family/patient expects to be discharged to:: Private residence Living Arrangements: Alone Available Help at Discharge: Family;Available PRN/intermittently Type of Home: House Home Access: Level entry     Home Layout: One level     Bathroom Shower/Tub: Chief Strategy Officer: Standard     Home Equipment: None          Prior Functioning/Environment Prior Level of Function : Independent/Modified Independent;Driving             Mobility Comments: no AD ADLs Comments: Indep with ADLs, iADLs, driving    OT Problem List: Other (comment) (impaired speech)   OT Treatment/Interventions: Self-care/ADL training;Therapeutic exercise;Energy conservation;DME and/or AE instruction;Therapeutic activities;Patient/family education;Balance training      OT Goals(Current goals can be found in the care plan section)   Acute Rehab OT Goals Patient Stated Goal: figure out what caused the stroke OT Goal Formulation: With patient Time For Goal Achievement: 07/30/23 Potential to Achieve Goals: Good   OT Frequency:  Min 1X/week    Co-evaluation              AM-PAC OT "6 Clicks" Daily Activity     Outcome Measure Help from another person eating meals?: None Help from another person taking care of personal grooming?: None Help from another person toileting, which includes using toliet, bedpan, or urinal?: A Little Help from another person bathing (including washing, rinsing, drying)?: A Little Help from another person to put on and taking off regular upper body clothing?:  None Help from another person to put on and taking off regular lower body clothing?: A Little 6 Click Score: 21   End of Session Equipment Utilized During Treatment: Gait belt Nurse Communication: Mobility status  Activity Tolerance: Patient tolerated treatment well Patient left: in chair;with call bell/phone within reach;with chair alarm set  OT Visit Diagnosis: Cognitive communication deficit (R41.841) Symptoms and signs involving cognitive functions: Cerebral infarction                Time: 6962-9528 OT Time Calculation (min): 34 min Charges:  OT General Charges $OT Visit: 1 Visit OT Evaluation $OT Eval Low Complexity: 1 Low OT Treatments $Self Care/Home Management : 8-22 mins  Lawrence Pretty, OTR/L Acute Rehab Services Office: [phone]   Shireen Dory 07/16/2023, 9:34 AM

## 2023-07-16 NOTE — Progress Notes (Signed)
 Bilateral lower extremity venous duplex has been completed. Preliminary results can be found in CV Proc through chart review.   07/16/23 3:01 PM Birda Buffy RVT

## 2023-07-16 NOTE — TOC CM/SW Note (Signed)
 Transition of Care [facility]) - Inpatient Brief Assessment   Patient Details  Name: Kathryn Cameron MRN: [id number] Date of Birth: 1955-05-08  [facility] CM/SW Contact:    Jonathan Neighbor, RN Phone Number: 07/16/2023, 3:33 PM   Clinical Narrative:  Awaiting PT note.  Transition of Care Asessment: Insurance and Status: Insurance coverage has been reviewed Patient has primary care physician: Yes Home environment has been reviewed: home alone   Prior/Current Home Services: No current home services Social Drivers of Health Review: SDOH reviewed no interventions necessary Readmission risk has been reviewed: Yes Transition of care needs: no transition of care needs at this time

## 2023-07-16 NOTE — Progress Notes (Signed)
  Echocardiogram 2D Echocardiogram has been performed.  Kathryn Cameron 07/16/2023, 2:04 PM

## 2023-07-16 NOTE — Progress Notes (Signed)
 STROKE TEAM PROGRESS NOTE   SUBJECTIVE (INTERVAL HISTORY) No family at the bedside. Pt sitting in chair, plan for angiogram tomorrow. Currently no heart palpitation and on tele NSR. However, will need heart monitoring on discharge.    OBJECTIVE Temp:  [98.4 F (36.9 C)-98.8 F (37.1 C)] 98.6 F (37 C) (06/09 1940) Pulse Rate:  [68-82] 78 (06/09 1940) Cardiac Rhythm: Normal sinus rhythm (06/09 1900) Resp:  [16-24] 20 (06/09 1940) BP: (106-132)/(56-87) 132/87 (06/09 1940) SpO2:  [93 %] 93 % (06/09 1940)  Recent Labs  Lab 07/14/23 1816  GLUCAP 108*   Recent Labs  Lab 07/14/23 1821 07/14/23 1852 07/15/23 0503  NA 138 138 138  K 3.8 3.8 3.5  CL 104 105 105  CO2  --  21* 24  GLUCOSE 109* 108* 95  BUN 23 23 18   CREATININE 0.90 0.84 0.88  CALCIUM  --  9.5 9.2   Recent Labs  Lab 07/14/23 1852 07/15/23 0503  AST 19 19  ALT 19 17  ALKPHOS 89 84  BILITOT 0.8 0.8  PROT 6.5 5.9*  ALBUMIN 3.8 3.4*   Recent Labs  Lab 07/14/23 1821 07/14/23 1852 07/15/23 0503  WBC  --  8.6 6.5  NEUTROABS  --  6.0  --   HGB 13.3 13.5 12.6  HCT 39.0 39.5 37.6  MCV  --  88.0 87.9  PLT  --  319 297   No results for input(s): "CKTOTAL", "CKMB", "CKMBINDEX", "TROPONINI" in the last 168 hours. Recent Labs    07/14/23 1852  LABPROT 13.6  INR 1.0   No results for input(s): "COLORURINE", "LABSPEC", "PHURINE", "GLUCOSEU", "HGBUR", "BILIRUBINUR", "KETONESUR", "PROTEINUR", "UROBILINOGEN", "NITRITE", "LEUKOCYTESUR" in the last 72 hours.  Invalid input(s): "APPERANCEUR"     Component Value Date/Time   CHOL 156 07/15/2023 0503   TRIG 101 07/15/2023 0503   HDL 40 (L) 07/15/2023 0503   CHOLHDL 3.9 07/15/2023 0503   VLDL 20 07/15/2023 0503   LDLCALC 96 07/15/2023 0503   Lab Results  Component Value Date   HGBA1C 5.3 07/15/2023      Component Value Date/Time   LABOPIA NONE DETECTED 07/14/2023 1942   COCAINSCRNUR NONE DETECTED 07/14/2023 1942   LABBENZ NONE DETECTED 07/14/2023 1942    AMPHETMU NONE DETECTED 07/14/2023 1942   THCU NONE DETECTED 07/14/2023 1942   LABBARB NONE DETECTED 07/14/2023 1942    Recent Labs  Lab 07/14/23 1852  ETH <15    I have personally reviewed the radiological images below and agree with the radiology interpretations.  ECHOCARDIOGRAM COMPLETE BUBBLE STUDY Result Date: 07/16/2023    ECHOCARDIOGRAM REPORT   Patient Name:   Kathryn Cameron Date of Exam: 07/16/2023 Medical Rec #:  [id number]      Height:       63.0 in Accession #:    [id number]     Weight:       184.7 lb Date of Birth:  07-19-1955      BSA:          1.870 m Patient Age:    67 years       BP:           106/82 mmHg Patient Gender: F              HR:           77 bpm. Exam Location:  Inpatient Procedure: 2D Echo, Cardiac Doppler, Color Doppler and Saline Contrast Bubble            Study (  Both Spectral and Color Flow Doppler were utilized during            procedure). Indications:    Stroke  History:        Patient has no prior history of Echocardiogram examinations.                 Stroke; Risk Factors:Sleep Apnea, Dyslipidemia and Hypertension.  Sonographer:    Raynelle Callow RDCS Referring Phys: [id number] [facility]  Sonographer Comments: Technically difficult study due to poor echo windows. IMPRESSIONS  1. Left ventricular ejection fraction, by estimation, is 65 to 70%. The left ventricle has normal function. The left ventricle has no regional wall motion abnormalities. Left ventricular diastolic parameters are consistent with Grade I diastolic dysfunction (impaired relaxation).  2. Right ventricular systolic function was not well visualized. The right ventricular size is normal.  3. The mitral valve is normal in structure. No evidence of mitral valve regurgitation. No evidence of mitral stenosis.  4. The aortic valve is calcified. Aortic valve regurgitation is moderate. No aortic stenosis is present.  5. Aortic dilatation noted. There is dilatation of the ascending aorta, measuring 42 mm.  6. Agitated  saline contrast bubble study was negative, with no evidence of any interatrial shunt. FINDINGS  Left Ventricle: Left ventricular ejection fraction, by estimation, is 65 to 70%. The left ventricle has normal function. The left ventricle has no regional wall motion abnormalities. The left ventricular internal cavity size was normal in size. There is  no left ventricular hypertrophy. Left ventricular diastolic parameters are consistent with Grade I diastolic dysfunction (impaired relaxation). Right Ventricle: The right ventricular size is normal. No increase in right ventricular wall thickness. Right ventricular systolic function was not well visualized. Left Atrium: Left atrial size was normal in size. Right Atrium: Right atrial size was normal in size. Pericardium: There is no evidence of pericardial effusion. Mitral Valve: The mitral valve is normal in structure. No evidence of mitral valve regurgitation. No evidence of mitral valve stenosis. Tricuspid Valve: The tricuspid valve is normal in structure. Tricuspid valve regurgitation is not demonstrated. No evidence of tricuspid stenosis. Aortic Valve: The aortic valve is calcified. Aortic valve regurgitation is moderate. No aortic stenosis is present. Aortic valve mean gradient measures 8.0 mmHg. Aortic valve peak gradient measures 14.6 mmHg. Aortic valve area, by VTI measures 4.00 cm. Pulmonic Valve: The pulmonic valve was normal in structure. Pulmonic valve regurgitation is trivial. No evidence of pulmonic stenosis. Aorta: Aortic dilatation noted. There is dilatation of the ascending aorta, measuring 42 mm. IAS/Shunts: No atrial level shunt detected by color flow Doppler. Agitated saline contrast was given intravenously to evaluate for intracardiac shunting. Agitated saline contrast bubble study was negative, with no evidence of any interatrial shunt.  LEFT VENTRICLE PLAX 2D LVIDd:         4.90 cm     Diastology LVIDs:         3.50 cm     LV e' medial:    5.11  cm/s LV PW:         1.10 cm     LV E/e' medial:  14.3 LV IVS:        1.20 cm     LV e' lateral:   7.72 cm/s LVOT diam:     2.70 cm     LV E/e' lateral: 9.5 LV SV:         147 LV SV Index:   79 LVOT Area:     5.73  cm  LV Volumes (MOD) LV vol d, MOD A2C: 60.6 ml LV vol d, MOD A4C: 54.6 ml LV vol s, MOD A2C: 13.8 ml LV vol s, MOD A4C: 21.0 ml LV SV MOD A2C:     46.8 ml LV SV MOD A4C:     54.6 ml LV SV MOD BP:      40.1 ml RIGHT VENTRICLE             IVC RV S prime:     10.60 cm/s  IVC diam: 2.10 cm TAPSE (M-mode): 1.2 cm LEFT ATRIUM             Index        RIGHT ATRIUM          Index LA diam:        1.90 cm 1.02 cm/m   RA Area:     9.10 cm LA Vol (A2C):   30.3 ml 16.21 ml/m  RA Volume:   16.30 ml 8.72 ml/m LA Vol (A4C):   39.6 ml 21.18 ml/m LA Biplane Vol: 34.6 ml 18.51 ml/m  AORTIC VALVE AV Area (Vmax):    3.42 cm AV Area (Vmean):   3.44 cm AV Area (VTI):     4.00 cm AV Vmax:           191.00 cm/s AV Vmean:          128.000 cm/s AV VTI:            0.368 m AV Peak Grad:      14.6 mmHg AV Mean Grad:      8.0 mmHg LVOT Vmax:         114.00 cm/s LVOT Vmean:        77.000 cm/s LVOT VTI:          0.257 m LVOT/AV VTI ratio: 0.70  AORTA Ao Root diam: 3.80 cm Ao Asc diam:  4.20 cm MITRAL VALVE MV Area (PHT): 3.08 cm    SHUNTS MV Decel Time: 246 msec    Systemic VTI:  0.26 m MV E velocity: 73.20 cm/s  Systemic Diam: 2.70 cm MV A velocity: 85.20 cm/s MV E/A ratio:  0.86 Arta Lark Electronically signed by Arta Lark Signature Date/Time: 07/16/2023/5:28:23 PM    Final    VAS US  LOWER EXTREMITY VENOUS (DVT) Result Date: 07/16/2023  Lower Venous DVT Study Patient Name:  Kathryn Cameron  Date of Exam:   07/16/2023 Medical Rec #: [id number]       Accession #:    [id number] Date of Birth: 1956/01/25       Patient Gender: F Patient Age:   63 years Exam Location:  [facility] Procedure:      VAS US  LOWER EXTREMITY VENOUS (DVT) Referring Phys: Fraser Jackson Mehar Sagen  --------------------------------------------------------------------------------  Indications: Stroke.  Risk Factors: None identified. Comparison Study: No prior studies. Performing Technologist: Lerry Ransom RVT  Examination Guidelines: A complete evaluation includes B-mode imaging, spectral Doppler, color Doppler, and power Doppler as needed of all accessible portions of each vessel. Bilateral testing is considered an integral part of a complete examination. Limited examinations for reoccurring indications may be performed as noted. The reflux portion of the exam is performed with the patient in reverse Trendelenburg.  +---------+---------------+---------+-----------+----------+--------------+ RIGHT    CompressibilityPhasicitySpontaneityPropertiesThrombus Aging +---------+---------------+---------+-----------+----------+--------------+ CFV      Full           Yes      Yes                                 +---------+---------------+---------+-----------+----------+--------------+  SFJ      Full                                                        +---------+---------------+---------+-----------+----------+--------------+ FV Prox  Full                                                        +---------+---------------+---------+-----------+----------+--------------+ FV Mid   Full                                                        +---------+---------------+---------+-----------+----------+--------------+ FV DistalFull                                                        +---------+---------------+---------+-----------+----------+--------------+ PFV      Full                                                        +---------+---------------+---------+-----------+----------+--------------+ POP      Full           Yes      Yes                                 +---------+---------------+---------+-----------+----------+--------------+ PTV      Full                                                         +---------+---------------+---------+-----------+----------+--------------+ PERO     Full                                                        +---------+---------------+---------+-----------+----------+--------------+   +---------+---------------+---------+-----------+----------+--------------+ LEFT     CompressibilityPhasicitySpontaneityPropertiesThrombus Aging +---------+---------------+---------+-----------+----------+--------------+ CFV      Full           Yes      Yes                                 +---------+---------------+---------+-----------+----------+--------------+ SFJ      Full                                                        +---------+---------------+---------+-----------+----------+--------------+  FV Prox  Full                                                        +---------+---------------+---------+-----------+----------+--------------+ FV Mid   Full                                                        +---------+---------------+---------+-----------+----------+--------------+ FV DistalFull                                                        +---------+---------------+---------+-----------+----------+--------------+ PFV      Full                                                        +---------+---------------+---------+-----------+----------+--------------+ POP      Full           Yes      Yes                                 +---------+---------------+---------+-----------+----------+--------------+ PTV      Full                                                        +---------+---------------+---------+-----------+----------+--------------+ PERO     Full                                                        +---------+---------------+---------+-----------+----------+--------------+     Summary: RIGHT: - There is no evidence of deep vein thrombosis in the  lower extremity.  - No cystic structure found in the popliteal fossa.  LEFT: - There is no evidence of deep vein thrombosis in the lower extremity.  - No cystic structure found in the popliteal fossa.  *See table(s) above for measurements and observations. Electronically signed by Irvin Mantel on 07/16/2023 at 4:49:10 PM.    Final    MR BRAIN WO CONTRAST Result Date: 07/15/2023 CLINICAL DATA:  68 year old female code stroke presentation yesterday. EXAM: MRI HEAD WITHOUT CONTRAST TECHNIQUE: Multiplanar, multiecho pulse sequences of the brain and surrounding structures were obtained without intravenous contrast. COMPARISON:  CT head, CTA head and neck yesterday. FINDINGS: Brain: Multifocal patchy and confluent restricted diffusion in the right hemisphere, ranging from small areas of cortical and white matter involvement at the right insula to more confluent involvement along the right posterior operculum, 2 a 4 cm area of confluent involvement at the anterior right occipital lobe from  the periventricular white matter to the lateral occipital cortex (series 3, image 29). T2 and FLAIR hyperintense cytotoxic edema in the affected areas. No convincing hemorrhagic transformation. No significant intracranial mass effect. No other diffusion restriction. No midline shift, mass effect, evidence of mass lesion, ventriculomegaly, extra-axial collection or acute intracranial hemorrhage. Cervicomedullary junction and pituitary are within normal limits. Scattered mild for age nonspecific cerebral white matter T2 and FLAIR hyperintensity elsewhere. No chronic cortical encephalomalacia or chronic cerebral blood products identified. Small chronic lacunar infarct of the right caudate nucleus. Deep gray nuclei, brainstem, cerebellum otherwise appear negative. Vascular: Major intracranial vascular flow voids are preserved, dominant distal right vertebral artery as seen by CTA. Skull and upper cervical spine: Normal for age. Visualized  bone marrow signal is within normal limits. Sinuses/Orbits: Negative. Paranasal Visualized paranasal sinuses and mastoids are stable and well aerated. Other: Visible internal auditory structures appear normal. IMPRESSION: 1. Multifocal Acute infarcts scattered in the middle Right MCA division, at the Right MCA/PCA watershed area. No hemorrhagic transformation or mass effect. 2. Mild for age underlying white matter disease, small chronic lacunar infarct in the right caudate nucleus. Electronically Signed   By: Marlise Simpers M.D.   On: 07/15/2023 09:58   CT ANGIO HEAD NECK W WO CM Result Date: 07/14/2023 CLINICAL DATA:  Acute ischemic attack EXAM: CT ANGIOGRAPHY HEAD AND NECK WITH AND WITHOUT CONTRAST TECHNIQUE: Multidetector CT imaging of the head and neck was performed using the standard protocol during bolus administration of intravenous contrast. Multiplanar CT image reconstructions and MIPs were obtained to evaluate the vascular anatomy. Carotid stenosis measurements (when applicable) are obtained utilizing NASCET criteria, using the distal internal carotid diameter as the denominator. RADIATION DOSE REDUCTION: This exam was performed according to the departmental dose-optimization program which includes automated exposure control, adjustment of the mA and/or kV according to patient size and/or use of iterative reconstruction technique. CONTRAST:  75mL OMNIPAQUE  IOHEXOL  350 MG/ML SOLN COMPARISON:  None Available. FINDINGS: CTA NECK FINDINGS Skeleton: No acute abnormality or high grade bony spinal canal stenosis. Other neck: Normal pharynx, larynx and major salivary glands. No cervical lymphadenopathy. Unremarkable thyroid gland. Upper chest: No pneumothorax or pleural effusion. No nodules or masses. Aortic arch: There is no calcific atherosclerosis of the aortic arch. Conventional 3 vessel aortic branching pattern. RIGHT carotid system: The common carotid artery and carotid bifurcation are normal. There is focal  fusiform dilatation of the distal ICA just proximal to the skull base with a maximum diameter of 10 mm. LEFT carotid system: The common carotid artery and carotid bifurcation are normal. The distal ICA is extremely tortuous with dilatation measuring up to 11 mm. There is a fenestration or dissection flap (5:143 and 8:121). Vertebral arteries: Right dominant configuration. Minimal atherosclerosis of both vertebral arteries are widely patent. CTA HEAD FINDINGS POSTERIOR CIRCULATION: Normal vertebral artery V4 segments. No proximal occlusion of the anterior or inferior cerebellar arteries. Basilar artery is normal. Superior cerebellar arteries are normal. Posterior cerebral arteries are normal. ANTERIOR CIRCULATION: Intracranial internal carotid arteries are normal. Anterior cerebral arteries are normal. Middle cerebral arteries are normal. Venous sinuses: As permitted by contrast timing, patent. Anatomic variants: Patent right P-comm Review of the MIP images confirms the above findings. IMPRESSION: 1. No emergent large vessel occlusion or high-grade stenosis of the intracranial arteries. 2. Fusiform dilatation of the distal left ICA with a fenestration or dissection flap. 3. Focal fusiform dilatation of the distal right ICA just proximal to the skull base with a maximum diameter of  10 mm. 4. No hemodynamically significant stenosis of the cervical carotid or vertebral arteries. Electronically Signed   By: Juanetta Nordmann M.D.   On: 07/14/2023 20:40   CT HEAD CODE STROKE WO CONTRAST Result Date: 07/14/2023 CLINICAL DATA:  Code stroke. Neuro deficit, concern for stroke, right-sided facial droop, confusion, slurred speech. EXAM: CT HEAD WITHOUT CONTRAST TECHNIQUE: Contiguous axial images were obtained from the base of the skull through the vertex without intravenous contrast. RADIATION DOSE REDUCTION: This exam was performed according to the departmental dose-optimization program which includes automated exposure control,  adjustment of the mA and/or kV according to patient size and/or use of iterative reconstruction technique. COMPARISON:  None Available. FINDINGS: Brain: No acute intracranial hemorrhage. Hypoattenuation and loss of gray-white differentiation in the posterior aspect of the right cerebral hemisphere involving the posterior right temporal lobe, lateral right occipital lobe and a portion of the right inferior parietal lobule. Nonspecific hypoattenuation in the periventricular and subcortical white matter favored to reflect chronic microvascular ischemic changes. No significant mass effect or midline shift. The basilar cisterns are patent. Posterior fossa is unremarkable. Ventricles: The ventricles are normal. Vascular: No hyperdense vessel or unexpected calcification. Skull: No acute or aggressive finding. Orbits: Orbits are symmetric. Sinuses: The visualized paranasal sinuses are clear. Other: Mastoid air cells are clear. ASPECTS [facility] Stroke Program Early CT Score) - Ganglionic level infarction (caudate, lentiform nuclei, internal capsule, insula, M1-M3 cortex): 6 - Supraganglionic infarction (M4-M6 cortex): 3 Total score (0-10 with 10 being normal): 9 IMPRESSION: 1. Focal hypoattenuation and loss of gray-white differentiation in the posterior aspect of the right MCA territory concerning for acute infarct. 2. No acute intracranial hemorrhage. 3. Mild chronic microvascular ischemic changes. 4. ASPECTS is 9 These results were communicated to Dr. Doretta Gant At 6:47 pm on 07/14/2023 by text page via the [facility] messaging system. Electronically Signed   By: Denny Flack M.D.   On: 07/14/2023 18:47     PHYSICAL EXAM  Temp:  [98.4 F (36.9 C)-98.8 F (37.1 C)] 98.6 F (37 C) (06/09 1940) Pulse Rate:  [68-82] 78 (06/09 1940) Resp:  [16-24] 20 (06/09 1940) BP: (106-132)/(56-87) 132/87 (06/09 1940) SpO2:  [93 %] 93 % (06/09 1940)  General - Well nourished, well developed, in no apparent distress.  Ophthalmologic -  fundi not visualized due to noncooperation.  Cardiovascular - Regular rhythm and rate.  Neuro - awake, alert, eyes open, told me age 69 instead of 42, told me month was May but self corrected to June after questioning. Mild expressive aphasia with frequent paraphasic error but some improvement from yesterday. Able to name and repeat and read but with frequent paraphasic errors. No gaze palsy, tracking bilaterally, visual field full but decreased visual acuity on the left visual field and with left visual field simultagnosia, PERRL. No facial droop. Tongue midline. Bilateral UEs 5/5, no drift. Bilaterally LEs 5/5, no drift. Sensation symmetrical bilaterally, b/l FTN intact, gait not tested.   ASSESSMENT/PLAN Kathryn Cameron is a left-handed 68 y.o. female with history of hypertension admitted for confusion, speech difficulty. No TNK given due to outside window.    Stroke:  right MCA multifocal infarct likely embolic secondary to cardioembolic source, concerning for undiagnosed A-fib CT right posterior MCA infarct CT head and neck left ICA distal fenestration versus dissection  MRI  Multifocal Acute infarcts scattered in the middle Right MCA division, at the Right MCA/PCA watershed area.  2D Echo EF 65-70%, no PFO LE venous Doppler no DVT Recommend loop recorder  prior to discharge to rule out A-fib LDL 96 HgbA1c pending UDS negative Lovenox  for VTE prophylaxis No antithrombotic prior to admission, now on aspirin 81 mg daily and clopidogrel 75 mg daily DAPT for 3 weeks and then as alone. Patient counseled to be compliant with her antithrombotic medications Ongoing aggressive stroke risk factor management Therapy recommendations: none Disposition: Pending  Heart palpitation One month ago, pt woke up from sleep had heart palpitation, irregular heartbeat and heart rate 160s, lasted about several hours.  She talked with her PCP and was told " sometimes it happens" 2-3 days ago, patient again  woke up with sleep had heart palpitation, but heart rate only 120s, however it lasted all day long. Given current embolic stroke, concerning for undiagnosed A-fib, recommend loop recorder prior to discharge to rule out A-fib.  Fusiform aneurysms, chronic CT head and neck Fusiform dilatation of the distal left ICA with a fenestration or dissection flap. Focal fusiform dilatation of the distal right ICA just proximal to the skull base with a maximum diameter of 10 mm. Likely chronic IR consulted by primary team Plan for diagnostic cerebral angiogram tomorrow  Hypertension Stable Home meds including amlodipine  2.5, lisinopril 20, HCTZ 12.5 Long term BP goal normotensive  Hyperlipidemia Home meds: None LDL 96, goal < 70 Now on Lipitor 40 Continue statin at discharge  Other Stroke Risk Factors Advanced age Obesity, Body mass index is 32.73 kg/m.   Other Active Problems None  Hospital day # 2    Consuelo Denmark, MD PhD Stroke Neurology 07/16/2023 9:23 PM    To contact Stroke Continuity provider, please refer to [email or web address]. After hours, contact General Neurology

## 2023-07-17 ENCOUNTER — Inpatient Hospital Stay (HOSPITAL_COMMUNITY)

## 2023-07-17 DIAGNOSIS — I69898 Other sequelae of other cerebrovascular disease: Secondary | ICD-10-CM | POA: Diagnosis not present

## 2023-07-17 DIAGNOSIS — R002 Palpitations: Secondary | ICD-10-CM | POA: Diagnosis not present

## 2023-07-17 DIAGNOSIS — I63411 Cerebral infarction due to embolism of right middle cerebral artery: Secondary | ICD-10-CM | POA: Diagnosis not present

## 2023-07-17 DIAGNOSIS — I639 Cerebral infarction, unspecified: Secondary | ICD-10-CM | POA: Diagnosis not present

## 2023-07-17 DIAGNOSIS — I10 Essential (primary) hypertension: Secondary | ICD-10-CM | POA: Diagnosis not present

## 2023-07-17 HISTORY — PX: IR ANGIO INTRA EXTRACRAN SEL COM CAROTID INNOMINATE BILAT MOD SED: IMG5360

## 2023-07-17 HISTORY — PX: IR ANGIO VERTEBRAL SEL SUBCLAVIAN INNOMINATE UNI L MOD SED: IMG5364

## 2023-07-17 LAB — CBC WITH DIFFERENTIAL/PLATELET
Abs Immature Granulocytes: 0.01 10*3/uL (ref 0.00–0.07)
Basophils Absolute: 0 10*3/uL (ref 0.0–0.1)
Basophils Relative: 0 %
Eosinophils Absolute: 0.1 10*3/uL (ref 0.0–0.5)
Eosinophils Relative: 2 %
HCT: 37.9 % (ref 36.0–46.0)
Hemoglobin: 12.7 g/dL (ref 12.0–15.0)
Immature Granulocytes: 0 %
Lymphocytes Relative: 27 %
Lymphs Abs: 1.9 10*3/uL (ref 0.7–4.0)
MCH: 29.7 pg (ref 26.0–34.0)
MCHC: 33.5 g/dL (ref 30.0–36.0)
MCV: 88.6 fL (ref 80.0–100.0)
Monocytes Absolute: 0.9 10*3/uL (ref 0.1–1.0)
Monocytes Relative: 13 %
Neutro Abs: 4.1 10*3/uL (ref 1.7–7.7)
Neutrophils Relative %: 58 %
Platelets: 303 10*3/uL (ref 150–400)
RBC: 4.28 MIL/uL (ref 3.87–5.11)
RDW: 13 % (ref 11.5–15.5)
WBC: 7 10*3/uL (ref 4.0–10.5)
nRBC: 0 % (ref 0.0–0.2)

## 2023-07-17 LAB — BASIC METABOLIC PANEL WITH GFR
Anion gap: 9 (ref 5–15)
BUN: 22 mg/dL (ref 8–23)
CO2: 25 mmol/L (ref 22–32)
Calcium: 9 mg/dL (ref 8.9–10.3)
Chloride: 106 mmol/L (ref 98–111)
Creatinine, Ser: 0.92 mg/dL (ref 0.44–1.00)
GFR, Estimated: >60 mL/min (ref >60)
Glucose, Bld: 105 mg/dL — ABNORMAL HIGH (ref 70–99)
Potassium: 4.1 mmol/L (ref 3.5–5.1)
Sodium: 140 mmol/L (ref 135–145)

## 2023-07-17 MED ORDER — MIDAZOLAM HCL 2 MG/2ML IJ SOLN
INTRAMUSCULAR | Status: AC
  Filled 2023-07-17: qty 2

## 2023-07-17 MED ORDER — LIDOCAINE HCL 1 % IJ SOLN
INTRAMUSCULAR | Status: AC
Start: 2023-07-17 — End: ?
  Filled 2023-07-17: qty 20

## 2023-07-17 MED ORDER — LIDOCAINE HCL (PF) 1 % IJ SOLN
20.0000 mL | Freq: Once | INTRAMUSCULAR | Status: AC
  Administered 2023-07-17: 20 mL

## 2023-07-17 MED ORDER — HEPARIN SODIUM (PORCINE) 1000 UNIT/ML IJ SOLN
INTRAMUSCULAR | Status: AC
  Filled 2023-07-17: qty 10

## 2023-07-17 MED ORDER — FENTANYL CITRATE (PF) 100 MCG/2ML IJ SOLN
INTRAMUSCULAR | Status: AC
Start: 2023-07-17 — End: ?
  Filled 2023-07-17: qty 2

## 2023-07-17 MED ORDER — MIDAZOLAM HCL 2 MG/2ML IJ SOLN
INTRAMUSCULAR | Status: AC | PRN
  Administered 2023-07-17: 1 mg via INTRAVENOUS

## 2023-07-17 MED ORDER — FENTANYL CITRATE (PF) 100 MCG/2ML IJ SOLN
INTRAMUSCULAR | Status: AC | PRN
  Administered 2023-07-17 (×2): 25 ug via INTRAVENOUS

## 2023-07-17 MED ORDER — IOHEXOL 300 MG/ML  SOLN
150.0000 mL | Freq: Once | INTRAMUSCULAR | Status: AC | PRN
  Administered 2023-07-17: 75 mL via INTRA_ARTERIAL

## 2023-07-17 MED ORDER — SODIUM CHLORIDE 0.9 % IV SOLN: INTRAVENOUS | Status: AC

## 2023-07-17 MED ORDER — HEPARIN SODIUM (PORCINE) 1000 UNIT/ML IJ SOLN
INTRAMUSCULAR | Status: AC | PRN
  Administered 2023-07-17: 1000 [IU] via INTRAVENOUS

## 2023-07-17 NOTE — Consult Note (Incomplete)
 ELECTROPHYSIOLOGY CONSULT NOTE  Patient ID: Kathryn Cameron MRN: [id number], DOB/AGE: 1956/01/30   Admit date: 07/14/2023 Date of Consult: 07/18/2023  Primary Physician: Lenell Query, PA-C Primary Cardiologist: None  Primary Electrophysiologist: New to None  Reason for Consultation: Cryptogenic stroke; recommendations regarding Implantable Loop Recorder Insurance: [employer]  History of Present Illness EP has been asked to evaluate Kathryn Cameron for placement of an implantable loop recorder to monitor for atrial fibrillation by Dr Christiane Cowing.  The patient was admitted on 07/14/2023 with slurred speech and confusion along with L-sided facial droop and L sided numbness.    She does have history of night-time palpitations. She was able to use her BP machine during one of the episodes and says that her HR was in the 160s and jumping all over the place.   I spoke with the patient's daughter, Kathryn Cameron, via speakerphone.   Imaging demonstrated right MCA multifocal infarct likely embolic secondary to cardioembolic source    She has undergone workup for stroke including:  CT right posterior MCA infarct CT head and neck left ICA distal fenestration versus dissection  MRI  Multifocal Acute infarcts scattered in the middle Right MCA division, at the Right MCA/PCA watershed area.  2D Echo EF 65-70%, no PFO LE venous Doppler no DVT LDL 96 HgbA1c pending UDS negative Lovenox  for VTE prophylaxis No antithrombotic prior to admission, now on aspirin 81 mg daily and clopidogrel 75 mg daily DAPT for 3 weeks and then as alone. Patient counseled to be compliant with her antithrombotic medications Ongoing aggressive stroke risk factor management   The patient has been monitored on telemetry which has demonstrated sinus rhythm with no arrhythmias.  Inpatient stroke work-up will not require a TEE per Neurology.   Echocardiogram as above. Lab work is reviewed.  Prior to admission, the patient  denies chest pain, shortness of breath, dizziness, palpitations, or syncope.  She is recovering from her stroke with plans to return home  at discharge.  Allergies, Past Medical, Surgical, Social, and Family Histories have been reviewed and are referenced here-in when relevant for medical decision making.   Inpatient Medications:   aspirin EC  81 mg Oral Daily   atorvastatin  40 mg Oral Daily   clopidogrel  75 mg Oral Daily   enoxaparin  (LOVENOX ) injection  40 mg Subcutaneous Q24H   pantoprazole   40 mg Oral Daily   sodium chloride flush  3-10 mL Intravenous Q12H    Physical Exam: Vitals:   07/17/23 2329 07/18/23 0419 07/18/23 0819 07/18/23 1211  BP: (!) 112/54 133/66 (!) 148/73 (!) 150/86  Pulse: 61 76 82 87  Resp: 20 20 18 17   Temp: 98.3 F (36.8 C) 98.5 F (36.9 C) 98.1 F (36.7 C) 98.4 F (36.9 C)  TempSrc: Oral Oral Oral Oral  SpO2: 93% 96% 96% 97%  Weight:      Height:        GEN- NAD. A&O x 3. Normal affect. HEENT: Normocephalic, atraumatic Lungs- CTAB, Normal effort.  Heart- Regular rate and rhythm rate and rhythm. No M/G/R.  Extremities- No peripheral edema. no clubbing or cyanosis Skin- warm and dry, no rash or lesion. Neuro some word finding difficulty, but able to converse easily. Moves all extremities   12-lead ECG  (personally reviewed) All prior EKG's in EPIC reviewed with no documented atrial fibrillation  Telemetry SR 70s (personally reviewed)  Assessment and Plan:  #) Cryptogenic stroke #) paroxysmal palpitations The patient presents with cryptogenic stroke.  The patient does not have a TEE planned for this AM.  I spoke at length with the patient about monitoring for afib with an implantable loop recorder.  Risks, benefits, and alteratives to implantable loop recorder were discussed with the patient today.   At this time, the patient is very clear in their decision to proceed with implantable loop recorder.     Wound care was reviewed with the  patient (keep incision clean and dry for 3 days). Please call with questions.     Adryen Cookson, NP 07/18/2023 3:44 PM

## 2023-07-17 NOTE — Progress Notes (Signed)
 Chaplain paged to unit to provide support to pt in advance of an angiogram scheduled for later today. Chaplain introduced spiritual care and asked open ended questions to facilitate emotional reflection and story telling.   Kathryn Cameron shared that she is scheduled for a procedure later today and was experiencing hesitation about it. It was her understanding that she was going to have a heart monitor inserted. She had some worries about the risks of the procedure particularly as she did not see any benefits. Kathryn Cameron reports that she has been aware of an irregular heart beat, but feels it was dismissed by her medical providers. She believes that even if an irregular rhythm was detected by the monitor, there would be no available intervention, so it did not make sense to risk the intervention.  Her concerns were elevated by her lack of preparation for such an emergency as she does not have a will. Chaplain utilized reflective listening to identify challenges as well as sources of strength and coping strategies. Chaplain encouraged Kathryn Cameron to voice her concerns to the medical team prior to her procedure today. Shortly after, pt's physician, Dr Christiane Cowing entered the room and chaplain advocated on pt behalf so that she could gain more understanding.  Chaplain transcribed Dr. Mollie Anger teaching and asked clarifying questions to ensure that the concerns Kathryn Cameron had voiced were heard and addressed by physician. Dr Christiane Cowing clarified that the procedure today was not to insert the loop monitor, but to get a better look at a vessel on the left side of her brain and possibly address that vessel if need be.  He also clarified that the loop monitor will be inserted before discharge, possibly tomorrow. That monitor will be able to identify irregular heart rhythms and determine whether Kathryn Cameron is experiencing A Fib, which can lead to blood clots, which may result in a stroke. He clarified that if she does have A Fib, there are available  interventions and also clarified that while Kathryn Cameron is sure that she feels an irregular rhythm sometimes, they need to clarify what is irregular about the rhythm prior to addressing that, but assured her that she could decline consent for the procedure if she is still unsure. Kathryn Cameron voiced greater clarity and more comfort with the procedure. She also shared her frustration in her limited ability to understand and remember. Chaplain reframed and reminded Kathryn Cameron that she is not only recovering from a stroke, but also learning new information.   Chaplain remained at pt bedside for further support as Kathryn Cameron reflected on the ways this stroke has brought questions to her mind as well as hopes about how it will impact her life moving forward. She is hopeful that she will return home soon, but also aware that her daughter may not let her go home alone, which she desperately wants. Chaplain concluded the visit with prayer at the pt's request and in her tradition. Kathryn Cameron expressed hope that this process may lead to a future calling to help others.  Please page as further needs arise.  Kathryn Cameron. Kathryn Cameron, M.Div. [facility] Chaplain Pager [phone] Office [phone]      07/17/23 1246  Spiritual Encounters  Type of Visit Initial  Care provided to: Patient  Conversation partners present during encounter Physician  Referral source Patient request  Reason for visit Urgent spiritual support  OnCall Visit Yes  Spiritual Framework  Presenting Themes Meaning/purpose/sources of inspiration;Goals in life/care;Significant life change;Coping tools;Impactful experiences and emotions;Community and relationships  Community/Connection Family;Friend(s)  Patient Stress Factors Family relationships;Health  changes;Lack of knowledge;Loss of control;Major life changes  Goals  Clinical Care Goals Return to home independently  Interventions  Spiritual Care Interventions Made Established relationship of care and  support;Compassionate presence;Reflective listening;Decision-making support/facilitation;Other (comment)  Intervention Outcomes  Outcomes Connection to spiritual care;Awareness around self/spiritual resourses;Connection to values and goals of care;Autonomy/agency;Awareness of health

## 2023-07-17 NOTE — Progress Notes (Signed)
 STROKE TEAM PROGRESS NOTE   SUBJECTIVE (INTERVAL HISTORY) No family at the bedside. Pt sitting in chair, plan for angiogram tomorrow. Currently no heart palpitation and on tele NSR. However, will need heart monitoring on discharge.    OBJECTIVE Temp:  [97.8 F (36.6 C)-98.6 F (37 C)] 98.6 F (37 C) (06/10 1615) Pulse Rate:  [68-79] 74 (06/10 1700) Cardiac Rhythm: Normal sinus rhythm (06/10 1535) Resp:  [10-25] 18 (06/10 1700) BP: (101-150)/(61-89) 146/78 (06/10 1700) SpO2:  [88 %-97 %] 93 % (06/10 1700) Weight:  [80.9 kg] 80.9 kg (06/10 0122)  Recent Labs  Lab 07/14/23 1816  GLUCAP 108*   Recent Labs  Lab 07/14/23 1821 07/14/23 1852 07/15/23 0503 07/17/23 0309  NA 138 138 138 140  K 3.8 3.8 3.5 4.1  CL 104 105 105 106  CO2  --  21* 24 25  GLUCOSE 109* 108* 95 105*  BUN 23 23 18 22   CREATININE 0.90 0.84 0.88 0.92  CALCIUM  --  9.5 9.2 9.0   Recent Labs  Lab 07/14/23 1852 07/15/23 0503  AST 19 19  ALT 19 17  ALKPHOS 89 84  BILITOT 0.8 0.8  PROT 6.5 5.9*  ALBUMIN 3.8 3.4*   Recent Labs  Lab 07/14/23 1821 07/14/23 1852 07/15/23 0503 07/17/23 0309  WBC  --  8.6 6.5 7.0  NEUTROABS  --  6.0  --  4.1  HGB 13.3 13.5 12.6 12.7  HCT 39.0 39.5 37.6 37.9  MCV  --  88.0 87.9 88.6  PLT  --  319 297 303   No results for input(s): "CKTOTAL", "CKMB", "CKMBINDEX", "TROPONINI" in the last 168 hours. Recent Labs    07/14/23 1852  LABPROT 13.6  INR 1.0   No results for input(s): "COLORURINE", "LABSPEC", "PHURINE", "GLUCOSEU", "HGBUR", "BILIRUBINUR", "KETONESUR", "PROTEINUR", "UROBILINOGEN", "NITRITE", "LEUKOCYTESUR" in the last 72 hours.  Invalid input(s): "APPERANCEUR"     Component Value Date/Time   CHOL 156 07/15/2023 0503   TRIG 101 07/15/2023 0503   HDL 40 (L) 07/15/2023 0503   CHOLHDL 3.9 07/15/2023 0503   VLDL 20 07/15/2023 0503   LDLCALC 96 07/15/2023 0503   Lab Results  Component Value Date   HGBA1C 5.3 07/15/2023      Component Value  Date/Time   LABOPIA NONE DETECTED 07/14/2023 1942   COCAINSCRNUR NONE DETECTED 07/14/2023 1942   LABBENZ NONE DETECTED 07/14/2023 1942   AMPHETMU NONE DETECTED 07/14/2023 1942   THCU NONE DETECTED 07/14/2023 1942   LABBARB NONE DETECTED 07/14/2023 1942    Recent Labs  Lab 07/14/23 1852  ETH <15    I have personally reviewed the radiological images below and agree with the radiology interpretations.  ECHOCARDIOGRAM COMPLETE BUBBLE STUDY Result Date: 07/16/2023    ECHOCARDIOGRAM REPORT   Patient Name:   ZALEY TALLEY Date of Exam: 07/16/2023 Medical Rec #:  [id number]      Height:       63.0 in Accession #:    [id number]     Weight:       184.7 lb Date of Birth:  03/14/1955      BSA:          1.870 m Patient Age:    68 years       BP:           106/82 mmHg Patient Gender: F              HR:           77 bpm.  Exam Location:  Inpatient Procedure: 2D Echo, Cardiac Doppler, Color Doppler and Saline Contrast Bubble            Study (Both Spectral and Color Flow Doppler were utilized during            procedure). Indications:    Stroke  History:        Patient has no prior history of Echocardiogram examinations.                 Stroke; Risk Factors:Sleep Apnea, Dyslipidemia and Hypertension.  Sonographer:    Raynelle Callow RDCS Referring Phys: [id number] [facility]  Sonographer Comments: Technically difficult study due to poor echo windows. IMPRESSIONS  1. Left ventricular ejection fraction, by estimation, is 65 to 70%. The left ventricle has normal function. The left ventricle has no regional wall motion abnormalities. Left ventricular diastolic parameters are consistent with Grade I diastolic dysfunction (impaired relaxation).  2. Right ventricular systolic function was not well visualized. The right ventricular size is normal.  3. The mitral valve is normal in structure. No evidence of mitral valve regurgitation. No evidence of mitral stenosis.  4. The aortic valve is calcified. Aortic valve regurgitation is  moderate. No aortic stenosis is present.  5. Aortic dilatation noted. There is dilatation of the ascending aorta, measuring 42 mm.  6. Agitated saline contrast bubble study was negative, with no evidence of any interatrial shunt. FINDINGS  Left Ventricle: Left ventricular ejection fraction, by estimation, is 65 to 70%. The left ventricle has normal function. The left ventricle has no regional wall motion abnormalities. The left ventricular internal cavity size was normal in size. There is  no left ventricular hypertrophy. Left ventricular diastolic parameters are consistent with Grade I diastolic dysfunction (impaired relaxation). Right Ventricle: The right ventricular size is normal. No increase in right ventricular wall thickness. Right ventricular systolic function was not well visualized. Left Atrium: Left atrial size was normal in size. Right Atrium: Right atrial size was normal in size. Pericardium: There is no evidence of pericardial effusion. Mitral Valve: The mitral valve is normal in structure. No evidence of mitral valve regurgitation. No evidence of mitral valve stenosis. Tricuspid Valve: The tricuspid valve is normal in structure. Tricuspid valve regurgitation is not demonstrated. No evidence of tricuspid stenosis. Aortic Valve: The aortic valve is calcified. Aortic valve regurgitation is moderate. No aortic stenosis is present. Aortic valve mean gradient measures 8.0 mmHg. Aortic valve peak gradient measures 14.6 mmHg. Aortic valve area, by VTI measures 4.00 cm. Pulmonic Valve: The pulmonic valve was normal in structure. Pulmonic valve regurgitation is trivial. No evidence of pulmonic stenosis. Aorta: Aortic dilatation noted. There is dilatation of the ascending aorta, measuring 42 mm. IAS/Shunts: No atrial level shunt detected by color flow Doppler. Agitated saline contrast was given intravenously to evaluate for intracardiac shunting. Agitated saline contrast bubble study was negative, with no  evidence of any interatrial shunt.  LEFT VENTRICLE PLAX 2D LVIDd:         4.90 cm     Diastology LVIDs:         3.50 cm     LV e' medial:    5.11 cm/s LV PW:         1.10 cm     LV E/e' medial:  14.3 LV IVS:        1.20 cm     LV e' lateral:   7.72 cm/s LVOT diam:     2.70 cm     LV  E/e' lateral: 9.5 LV SV:         147 LV SV Index:   79 LVOT Area:     5.73 cm  LV Volumes (MOD) LV vol d, MOD A2C: 60.6 ml LV vol d, MOD A4C: 54.6 ml LV vol s, MOD A2C: 13.8 ml LV vol s, MOD A4C: 21.0 ml LV SV MOD A2C:     46.8 ml LV SV MOD A4C:     54.6 ml LV SV MOD BP:      40.1 ml RIGHT VENTRICLE             IVC RV S prime:     10.60 cm/s  IVC diam: 2.10 cm TAPSE (M-mode): 1.2 cm LEFT ATRIUM             Index        RIGHT ATRIUM          Index LA diam:        1.90 cm 1.02 cm/m   RA Area:     9.10 cm LA Vol (A2C):   30.3 ml 16.21 ml/m  RA Volume:   16.30 ml 8.72 ml/m LA Vol (A4C):   39.6 ml 21.18 ml/m LA Biplane Vol: 34.6 ml 18.51 ml/m  AORTIC VALVE AV Area (Vmax):    3.42 cm AV Area (Vmean):   3.44 cm AV Area (VTI):     4.00 cm AV Vmax:           191.00 cm/s AV Vmean:          128.000 cm/s AV VTI:            0.368 m AV Peak Grad:      14.6 mmHg AV Mean Grad:      8.0 mmHg LVOT Vmax:         114.00 cm/s LVOT Vmean:        77.000 cm/s LVOT VTI:          0.257 m LVOT/AV VTI ratio: 0.70  AORTA Ao Root diam: 3.80 cm Ao Asc diam:  4.20 cm MITRAL VALVE MV Area (PHT): 3.08 cm    SHUNTS MV Decel Time: 246 msec    Systemic VTI:  0.26 m MV E velocity: 73.20 cm/s  Systemic Diam: 2.70 cm MV A velocity: 85.20 cm/s MV E/A ratio:  0.86 Arta Lark Electronically signed by Arta Lark Signature Date/Time: 07/16/2023/5:28:23 PM    Final    VAS US  LOWER EXTREMITY VENOUS (DVT) Result Date: 07/16/2023  Lower Venous DVT Study Patient Name:  MEENA BARRANTES  Date of Exam:   07/16/2023 Medical Rec #: [id number]       Accession #:    [id number] Date of Birth: 06-27-1955       Patient Gender: F Patient Age:   72 years Exam Location:  [facility] Procedure:      VAS US  LOWER EXTREMITY VENOUS (DVT) Referring Phys: Fraser Jackson Antowan Samford --------------------------------------------------------------------------------  Indications: Stroke.  Risk Factors: None identified. Comparison Study: No prior studies. Performing Technologist: Lerry Ransom RVT  Examination Guidelines: A complete evaluation includes B-mode imaging, spectral Doppler, color Doppler, and power Doppler as needed of all accessible portions of each vessel. Bilateral testing is considered an integral part of a complete examination. Limited examinations for reoccurring indications may be performed as noted. The reflux portion of the exam is performed with the patient in reverse Trendelenburg.  +---------+---------------+---------+-----------+----------+--------------+ RIGHT    CompressibilityPhasicitySpontaneityPropertiesThrombus Aging +---------+---------------+---------+-----------+----------+--------------+ CFV      Full  Yes      Yes                                 +---------+---------------+---------+-----------+----------+--------------+ SFJ      Full                                                        +---------+---------------+---------+-----------+----------+--------------+ FV Prox  Full                                                        +---------+---------------+---------+-----------+----------+--------------+ FV Mid   Full                                                        +---------+---------------+---------+-----------+----------+--------------+ FV DistalFull                                                        +---------+---------------+---------+-----------+----------+--------------+ PFV      Full                                                        +---------+---------------+---------+-----------+----------+--------------+ POP      Full           Yes      Yes                                  +---------+---------------+---------+-----------+----------+--------------+ PTV      Full                                                        +---------+---------------+---------+-----------+----------+--------------+ PERO     Full                                                        +---------+---------------+---------+-----------+----------+--------------+   +---------+---------------+---------+-----------+----------+--------------+ LEFT     CompressibilityPhasicitySpontaneityPropertiesThrombus Aging +---------+---------------+---------+-----------+----------+--------------+ CFV      Full           Yes      Yes                                 +---------+---------------+---------+-----------+----------+--------------+ SFJ  Full                                                        +---------+---------------+---------+-----------+----------+--------------+ FV Prox  Full                                                        +---------+---------------+---------+-----------+----------+--------------+ FV Mid   Full                                                        +---------+---------------+---------+-----------+----------+--------------+ FV DistalFull                                                        +---------+---------------+---------+-----------+----------+--------------+ PFV      Full                                                        +---------+---------------+---------+-----------+----------+--------------+ POP      Full           Yes      Yes                                 +---------+---------------+---------+-----------+----------+--------------+ PTV      Full                                                        +---------+---------------+---------+-----------+----------+--------------+ PERO     Full                                                         +---------+---------------+---------+-----------+----------+--------------+     Summary: RIGHT: - There is no evidence of deep vein thrombosis in the lower extremity.  - No cystic structure found in the popliteal fossa.  LEFT: - There is no evidence of deep vein thrombosis in the lower extremity.  - No cystic structure found in the popliteal fossa.  *See table(s) above for measurements and observations. Electronically signed by Irvin Mantel on 07/16/2023 at 4:49:10 PM.    Final    MR BRAIN WO CONTRAST Result Date: 07/15/2023 CLINICAL DATA:  68 year old female code stroke presentation yesterday. EXAM: MRI HEAD WITHOUT CONTRAST TECHNIQUE: Multiplanar, multiecho pulse sequences of the brain and surrounding structures were obtained without intravenous contrast. COMPARISON:  CT head, CTA head and neck yesterday. FINDINGS: Brain: Multifocal patchy and confluent restricted diffusion in the right hemisphere, ranging from small areas of cortical and white matter involvement at the right insula to more confluent involvement along the right posterior operculum, 2 a 4 cm area of confluent involvement at the anterior right occipital lobe from the periventricular white matter to the lateral occipital cortex (series 3, image 29). T2 and FLAIR hyperintense cytotoxic edema in the affected areas. No convincing hemorrhagic transformation. No significant intracranial mass effect. No other diffusion restriction. No midline shift, mass effect, evidence of mass lesion, ventriculomegaly, extra-axial collection or acute intracranial hemorrhage. Cervicomedullary junction and pituitary are within normal limits. Scattered mild for age nonspecific cerebral white matter T2 and FLAIR hyperintensity elsewhere. No chronic cortical encephalomalacia or chronic cerebral blood products identified. Small chronic lacunar infarct of the right caudate nucleus. Deep gray nuclei, brainstem, cerebellum otherwise appear negative. Vascular: Major intracranial  vascular flow voids are preserved, dominant distal right vertebral artery as seen by CTA. Skull and upper cervical spine: Normal for age. Visualized bone marrow signal is within normal limits. Sinuses/Orbits: Negative. Paranasal Visualized paranasal sinuses and mastoids are stable and well aerated. Other: Visible internal auditory structures appear normal. IMPRESSION: 1. Multifocal Acute infarcts scattered in the middle Right MCA division, at the Right MCA/PCA watershed area. No hemorrhagic transformation or mass effect. 2. Mild for age underlying white matter disease, small chronic lacunar infarct in the right caudate nucleus. Electronically Signed   By: Marlise Simpers M.D.   On: 07/15/2023 09:58   CT ANGIO HEAD NECK W WO CM Result Date: 07/14/2023 CLINICAL DATA:  Acute ischemic attack EXAM: CT ANGIOGRAPHY HEAD AND NECK WITH AND WITHOUT CONTRAST TECHNIQUE: Multidetector CT imaging of the head and neck was performed using the standard protocol during bolus administration of intravenous contrast. Multiplanar CT image reconstructions and MIPs were obtained to evaluate the vascular anatomy. Carotid stenosis measurements (when applicable) are obtained utilizing NASCET criteria, using the distal internal carotid diameter as the denominator. RADIATION DOSE REDUCTION: This exam was performed according to the departmental dose-optimization program which includes automated exposure control, adjustment of the mA and/or kV according to patient size and/or use of iterative reconstruction technique. CONTRAST:  75mL OMNIPAQUE  IOHEXOL  350 MG/ML SOLN COMPARISON:  None Available. FINDINGS: CTA NECK FINDINGS Skeleton: No acute abnormality or high grade bony spinal canal stenosis. Other neck: Normal pharynx, larynx and major salivary glands. No cervical lymphadenopathy. Unremarkable thyroid gland. Upper chest: No pneumothorax or pleural effusion. No nodules or masses. Aortic arch: There is no calcific atherosclerosis of the aortic arch.  Conventional 3 vessel aortic branching pattern. RIGHT carotid system: The common carotid artery and carotid bifurcation are normal. There is focal fusiform dilatation of the distal ICA just proximal to the skull base with a maximum diameter of 10 mm. LEFT carotid system: The common carotid artery and carotid bifurcation are normal. The distal ICA is extremely tortuous with dilatation measuring up to 11 mm. There is a fenestration or dissection flap (5:143 and 8:121). Vertebral arteries: Right dominant configuration. Minimal atherosclerosis of both vertebral arteries are widely patent. CTA HEAD FINDINGS POSTERIOR CIRCULATION: Normal vertebral artery V4 segments. No proximal occlusion of the anterior or inferior cerebellar arteries. Basilar artery is normal. Superior cerebellar arteries are normal. Posterior cerebral arteries are normal. ANTERIOR CIRCULATION: Intracranial internal carotid arteries are normal. Anterior cerebral arteries are normal. Middle cerebral arteries are normal. Venous sinuses: As permitted by contrast timing, patent. Anatomic variants: Patent right P-comm  Review of the MIP images confirms the above findings. IMPRESSION: 1. No emergent large vessel occlusion or high-grade stenosis of the intracranial arteries. 2. Fusiform dilatation of the distal left ICA with a fenestration or dissection flap. 3. Focal fusiform dilatation of the distal right ICA just proximal to the skull base with a maximum diameter of 10 mm. 4. No hemodynamically significant stenosis of the cervical carotid or vertebral arteries. Electronically Signed   By: Juanetta Nordmann M.D.   On: 07/14/2023 20:40   CT HEAD CODE STROKE WO CONTRAST Result Date: 07/14/2023 CLINICAL DATA:  Code stroke. Neuro deficit, concern for stroke, right-sided facial droop, confusion, slurred speech. EXAM: CT HEAD WITHOUT CONTRAST TECHNIQUE: Contiguous axial images were obtained from the base of the skull through the vertex without intravenous contrast.  RADIATION DOSE REDUCTION: This exam was performed according to the departmental dose-optimization program which includes automated exposure control, adjustment of the mA and/or kV according to patient size and/or use of iterative reconstruction technique. COMPARISON:  None Available. FINDINGS: Brain: No acute intracranial hemorrhage. Hypoattenuation and loss of gray-white differentiation in the posterior aspect of the right cerebral hemisphere involving the posterior right temporal lobe, lateral right occipital lobe and a portion of the right inferior parietal lobule. Nonspecific hypoattenuation in the periventricular and subcortical white matter favored to reflect chronic microvascular ischemic changes. No significant mass effect or midline shift. The basilar cisterns are patent. Posterior fossa is unremarkable. Ventricles: The ventricles are normal. Vascular: No hyperdense vessel or unexpected calcification. Skull: No acute or aggressive finding. Orbits: Orbits are symmetric. Sinuses: The visualized paranasal sinuses are clear. Other: Mastoid air cells are clear. ASPECTS [facility] Stroke Program Early CT Score) - Ganglionic level infarction (caudate, lentiform nuclei, internal capsule, insula, M1-M3 cortex): 6 - Supraganglionic infarction (M4-M6 cortex): 3 Total score (0-10 with 10 being normal): 9 IMPRESSION: 1. Focal hypoattenuation and loss of gray-white differentiation in the posterior aspect of the right MCA territory concerning for acute infarct. 2. No acute intracranial hemorrhage. 3. Mild chronic microvascular ischemic changes. 4. ASPECTS is 9 These results were communicated to Dr. Doretta Gant At 6:47 pm on 07/14/2023 by text page via the [facility] messaging system. Electronically Signed   By: Denny Flack M.D.   On: 07/14/2023 18:47     PHYSICAL EXAM  Temp:  [97.8 F (36.6 C)-98.6 F (37 C)] 98.6 F (37 C) (06/10 1615) Pulse Rate:  [68-79] 74 (06/10 1700) Resp:  [10-25] 18 (06/10 1700) BP:  (101-150)/(61-89) 146/78 (06/10 1700) SpO2:  [88 %-97 %] 93 % (06/10 1700) Weight:  [80.9 kg] 80.9 kg (06/10 0122)  General - Well nourished, well developed, in no apparent distress.  Ophthalmologic - fundi not visualized due to noncooperation.  Cardiovascular - Regular rhythm and rate.  Neuro - awake, alert, eyes open, told me age 32 instead of 78, told me month was May but self corrected to June after questioning. Mild expressive aphasia with frequent paraphasic error but some improvement from yesterday. Able to name and repeat and read but with frequent paraphasic errors. No gaze palsy, tracking bilaterally, visual field full but decreased visual acuity on the left visual field and with left visual field simultagnosia, PERRL. No facial droop. Tongue midline. Bilateral UEs 5/5, no drift. Bilaterally LEs 5/5, no drift. Sensation symmetrical bilaterally, b/l FTN intact, gait not tested.   ASSESSMENT/PLAN Ms. Kathryn Cameron is a left-handed 68 y.o. female with history of hypertension admitted for confusion, speech difficulty. No TNK given due to outside window.  Stroke:  right MCA multifocal infarct likely embolic secondary to cardioembolic source, concerning for undiagnosed A-fib CT right posterior MCA infarct CT head and neck left ICA distal fenestration versus dissection  MRI  Multifocal Acute infarcts scattered in the middle Right MCA division, at the Right MCA/PCA watershed area.  2D Echo EF 65-70%, no PFO LE venous Doppler no DVT Recommend loop recorder prior to discharge to rule out A-fib LDL 96 HgbA1c 5.3 UDS negative Lovenox  for VTE prophylaxis No antithrombotic prior to admission, now on aspirin 81 mg daily and clopidogrel 75 mg daily DAPT for 3 weeks and then as alone. Patient counseled to be compliant with her antithrombotic medications Ongoing aggressive stroke risk factor management Therapy recommendations: none Disposition: Pending  Heart palpitation One month ago, pt  woke up from sleep had heart palpitation, irregular heartbeat and heart rate 160s, lasted about several hours.  She talked with her PCP and was told " sometimes it happens" 2-3 days ago, patient again woke up with sleep had heart palpitation, but heart rate only 120s, however it lasted all day long. Given current embolic stroke, concerning for undiagnosed A-fib, recommend loop recorder prior to discharge to rule out A-fib.  Fusiform aneurysms, chronic CT head and neck Fusiform dilatation of the distal left ICA with a fenestration or dissection flap. Focal fusiform dilatation of the distal right ICA just proximal to the skull base with a maximum diameter of 10 mm. 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    Hypertension Stable Home meds including amlodipine  2.5, lisinopril 20, HCTZ 12.5 Long term BP goal normotensive  Hyperlipidemia Home meds: None LDL 96, goal < 70 Now on Lipitor 40 Continue statin at discharge  Other Stroke Risk Factors Advanced age Obesity, Body mass index is 31.58 kg/m.   Other Active Problems   Hospital day # 3  I had long discussion with daughter over the phone, updated pt current condition, treatment plan and potential prognosis, and answered all the questions. She expressed understanding and appreciation.    Consuelo Denmark, MD PhD Stroke Neurology 07/17/2023 5:53 PM    To contact Stroke Continuity provider, please refer to [email or web address]. After hours, contact General Neurology

## 2023-07-17 NOTE — Procedures (Signed)
 INR.  Status post bilateral common carotid and left vertebral artery angiograms.  Right CFA approach.  The entire study is somewhat suboptimal secondary to persistent motion artifact.  Findings.  1. Diffuse moderate fusiform dilatation of the right internal carotid artery in mid to distal one third and to lesser degree the petrous segment.  2.  Moderate fusiform dilatation of the mid cervical left ICA associated with tortuosity and the presence of a focal outpouching  projecting superiorly and somewhat laterally measuring 13.4 mm x 9.8 mm most consistent with a pseudoaneurysm.  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  Jory Ng MD.

## 2023-07-17 NOTE — Progress Notes (Signed)
  Progress Note   Patient: Kathryn Cameron ZOX:[id number] DOB: [birth year] DOA: 07/14/2023     3 DOS: the patient was seen and examined on 07/17/2023   Brief hospital course: Ms. Jia is a 68 yo female with PMH HTN, OSA, B12 deficiency, vitamin D deficiency who presented after developing slurred speech and confusion.  There was also report of left-sided facial droop left-sided numbness. She was admitted for stroke workup. MRI brain showed multiple acute infarcts involving right MCA.  CT angio head/neck showed bilateral ICA fusiform dilation. Patient had also mentioned feeling elevated heart rate and palpitations over the past couple months.  She states her rate was as fast as in the 160s when she measured it at home.  She has no known history of arrhythmia or cardiac issues otherwise. She was admitted for ongoing stroke workup.  Assessment and Plan: * Acute ischemic cerebrovascular accident (CVA) (HCC) - Appreciate neurology f/u - ASA 81 mg PO daily  - Lipitor 40 mg PO daily  - Plavix 75 mg PO daily  - IR angiogram planned for 07/17/2023 (currently pending)   Hyperlipidemia - Statin as above   Essential hypertension - Permissive hypertension.  Goal BP less than 180/105   Subjective: Pt seen and examined at the bedside. She awaits her angiogram with IR today. Consider DC tmr 07/18/2023 (with cardiac monitoring as requested by neurology).  Physical Exam: Vitals:   07/17/23 0300 07/17/23 0400 07/17/23 0758 07/17/23 1117  BP:  134/75 (!) 146/76   Pulse: 68 73 73 79  Resp: (!) 21 20 20    Temp:  98.2 F (36.8 C) 98.6 F (37 C) 98.2 F (36.8 C)  TempSrc:  Oral Oral Oral  SpO2: 90% 95% 91% 94%  Weight:      Height:       Physical Exam HENT:     Head: Normocephalic.     Mouth/Throat:     Mouth: Mucous membranes are moist.  Cardiovascular:     Rate and Rhythm: Normal rate.  Pulmonary:     Effort: Pulmonary effort is normal.  Abdominal:     Palpations: Abdomen is soft.   Musculoskeletal:        General: Normal range of motion.  Skin:    General: Skin is warm.  Neurological:     Mental Status: She is alert. Mental status is at baseline.  Psychiatric:        Mood and Affect: Mood normal.      Disposition: Status is: Inpatient Remains inpatient appropriate because: angiogram with IR   Planned Discharge Destination: Home    Time spent: 35 minutes  Author: Bernece Gall , MD 07/17/2023 11:45 AM  For on call review [email or web address].

## 2023-07-17 NOTE — H&P (View-Only) (Signed)
 ELECTROPHYSIOLOGY CONSULT NOTE  Patient ID: Kathryn Cameron MRN: [id number], DOB/AGE: 1956/01/30   Admit date: 07/14/2023 Date of Consult: 07/18/2023  Primary Physician: Lenell Query, PA-C Primary Cardiologist: None  Primary Electrophysiologist: New to None  Reason for Consultation: Cryptogenic stroke; recommendations regarding Implantable Loop Recorder Insurance: [employer]  History of Present Illness EP has been asked to evaluate Kathryn Cameron for placement of an implantable loop recorder to monitor for atrial fibrillation by Dr Christiane Cowing.  The patient was admitted on 07/14/2023 with slurred speech and confusion along with L-sided facial droop and L sided numbness.    She does have history of night-time palpitations. She was able to use her BP machine during one of the episodes and says that her HR was in the 160s and jumping all over the place.   I spoke with the patient's daughter, Kathryn Cameron, via speakerphone.   Imaging demonstrated right MCA multifocal infarct likely embolic secondary to cardioembolic source    She has undergone workup for stroke including:  CT right posterior MCA infarct CT head and neck left ICA distal fenestration versus dissection  MRI  Multifocal Acute infarcts scattered in the middle Right MCA division, at the Right MCA/PCA watershed area.  2D Echo EF 65-70%, no PFO LE venous Doppler no DVT LDL 96 HgbA1c pending UDS negative Lovenox  for VTE prophylaxis No antithrombotic prior to admission, now on aspirin 81 mg daily and clopidogrel 75 mg daily DAPT for 3 weeks and then as alone. Patient counseled to be compliant with her antithrombotic medications Ongoing aggressive stroke risk factor management   The patient has been monitored on telemetry which has demonstrated sinus rhythm with no arrhythmias.  Inpatient stroke work-up will not require a TEE per Neurology.   Echocardiogram as above. Lab work is reviewed.  Prior to admission, the patient  denies chest pain, shortness of breath, dizziness, palpitations, or syncope.  She is recovering from her stroke with plans to return home  at discharge.  Allergies, Past Medical, Surgical, Social, and Family Histories have been reviewed and are referenced here-in when relevant for medical decision making.   Inpatient Medications:   aspirin EC  81 mg Oral Daily   atorvastatin  40 mg Oral Daily   clopidogrel  75 mg Oral Daily   enoxaparin  (LOVENOX ) injection  40 mg Subcutaneous Q24H   pantoprazole   40 mg Oral Daily   sodium chloride flush  3-10 mL Intravenous Q12H    Physical Exam: Vitals:   07/17/23 2329 07/18/23 0419 07/18/23 0819 07/18/23 1211  BP: (!) 112/54 133/66 (!) 148/73 (!) 150/86  Pulse: 61 76 82 87  Resp: 20 20 18 17   Temp: 98.3 F (36.8 C) 98.5 F (36.9 C) 98.1 F (36.7 C) 98.4 F (36.9 C)  TempSrc: Oral Oral Oral Oral  SpO2: 93% 96% 96% 97%  Weight:      Height:        GEN- NAD. A&O x 3. Normal affect. HEENT: Normocephalic, atraumatic Lungs- CTAB, Normal effort.  Heart- Regular rate and rhythm rate and rhythm. No M/G/R.  Extremities- No peripheral edema. no clubbing or cyanosis Skin- warm and dry, no rash or lesion. Neuro some word finding difficulty, but able to converse easily. Moves all extremities   12-lead ECG  (personally reviewed) All prior EKG's in EPIC reviewed with no documented atrial fibrillation  Telemetry SR 70s (personally reviewed)  Assessment and Plan:  #) Cryptogenic stroke #) paroxysmal palpitations The patient presents with cryptogenic stroke.  The patient does not have a TEE planned for this AM.  I spoke at length with the patient about monitoring for afib with an implantable loop recorder.  Risks, benefits, and alteratives to implantable loop recorder were discussed with the patient today.   At this time, the patient is very clear in their decision to proceed with implantable loop recorder.     Wound care was reviewed with the  patient (keep incision clean and dry for 3 days). Please call with questions.     Kathryn Cookson, NP 07/18/2023 3:44 PM

## 2023-07-18 ENCOUNTER — Encounter (HOSPITAL_COMMUNITY)
Admission: EM | Disposition: A | Discharge status: Home / Self Care | Payer: Self-pay | Source: Home / Self Care | Attending: Internal Medicine

## 2023-07-18 ENCOUNTER — Ambulatory Visit (HOSPITAL_COMMUNITY): Admit: 2023-07-18 | Admitting: Cardiology

## 2023-07-18 DIAGNOSIS — R002 Palpitations: Secondary | ICD-10-CM | POA: Diagnosis not present

## 2023-07-18 DIAGNOSIS — I10 Essential (primary) hypertension: Secondary | ICD-10-CM | POA: Diagnosis not present

## 2023-07-18 DIAGNOSIS — I63411 Cerebral infarction due to embolism of right middle cerebral artery: Secondary | ICD-10-CM | POA: Diagnosis not present

## 2023-07-18 DIAGNOSIS — I639 Cerebral infarction, unspecified: Secondary | ICD-10-CM | POA: Diagnosis not present

## 2023-07-18 DIAGNOSIS — I69898 Other sequelae of other cerebrovascular disease: Secondary | ICD-10-CM | POA: Diagnosis not present

## 2023-07-18 DIAGNOSIS — I6389 Other cerebral infarction: Secondary | ICD-10-CM

## 2023-07-18 HISTORY — PX: LOOP RECORDER INSERTION: EP1214

## 2023-07-18 LAB — COMPREHENSIVE METABOLIC PANEL WITH GFR
ALT: 19 U/L (ref 0–44)
AST: 19 U/L (ref 15–41)
Albumin: 3 g/dL — ABNORMAL LOW (ref 3.5–5.0)
Alkaline Phosphatase: 85 U/L (ref 38–126)
Anion gap: 7 (ref 5–15)
BUN: 19 mg/dL (ref 8–23)
CO2: 24 mmol/L (ref 22–32)
Calcium: 8.7 mg/dL — ABNORMAL LOW (ref 8.9–10.3)
Chloride: 108 mmol/L (ref 98–111)
Creatinine, Ser: 0.85 mg/dL (ref 0.44–1.00)
GFR, Estimated: >60 mL/min (ref >60)
Glucose, Bld: 89 mg/dL (ref 70–99)
Potassium: 4.1 mmol/L (ref 3.5–5.1)
Sodium: 139 mmol/L (ref 135–145)
Total Bilirubin: 1 mg/dL (ref 0.0–1.2)
Total Protein: 5.8 g/dL — ABNORMAL LOW (ref 6.5–8.1)

## 2023-07-18 LAB — CBC
HCT: 35.3 % — ABNORMAL LOW (ref 36.0–46.0)
Hemoglobin: 11.9 g/dL — ABNORMAL LOW (ref 12.0–15.0)
MCH: 29.6 pg (ref 26.0–34.0)
MCHC: 33.7 g/dL (ref 30.0–36.0)
MCV: 87.8 fL (ref 80.0–100.0)
Platelets: 270 10*3/uL (ref 150–400)
RBC: 4.02 MIL/uL (ref 3.87–5.11)
RDW: 12.9 % (ref 11.5–15.5)
WBC: 6.6 10*3/uL (ref 4.0–10.5)
nRBC: 0 % (ref 0.0–0.2)

## 2023-07-18 LAB — MAGNESIUM: Magnesium: 1.8 mg/dL (ref 1.7–2.4)

## 2023-07-18 SURGERY — LOOP RECORDER INSERTION
Anesthesia: LOCAL

## 2023-07-18 MED ORDER — CLOPIDOGREL BISULFATE 75 MG PO TABS: 75.0000 mg | ORAL_TABLET | Freq: Every day | ORAL | 0 refills | Status: AC

## 2023-07-18 MED ORDER — LIDOCAINE-EPINEPHRINE 1 %-1:100000 IJ SOLN
INTRAMUSCULAR | Status: AC
  Filled 2023-07-18: qty 1

## 2023-07-18 MED ORDER — ATORVASTATIN CALCIUM 40 MG PO TABS: 40.0000 mg | ORAL_TABLET | Freq: Every day | ORAL | 0 refills | Status: AC

## 2023-07-18 MED ORDER — LIDOCAINE-EPINEPHRINE 1 %-1:100000 IJ SOLN
INTRAMUSCULAR | Status: DC | PRN
  Administered 2023-07-18: 20 mL

## 2023-07-18 MED ORDER — ASPIRIN 81 MG PO TBEC: 81.0000 mg | DELAYED_RELEASE_TABLET | Freq: Every day | ORAL | 0 refills | Status: AC

## 2023-07-18 SURGICAL SUPPLY — 2 items
MONITOR REVEAL LINQ II (Prosthesis & Implant Heart) IMPLANT
PACK LOOP INSERTION (CUSTOM PROCEDURE TRAY) ×1 IMPLANT

## 2023-07-18 NOTE — Progress Notes (Signed)
 STROKE TEAM PROGRESS NOTE   SUBJECTIVE (INTERVAL HISTORY) No acute event overnight.  Aphasia continues to improve, pending loop recorder before discharge.   OBJECTIVE Temp:  [98.1 F (36.7 C)-99.1 F (37.3 C)] 98.5 F (36.9 C) (06/11 1622) Pulse Rate:  [61-87] 78 (06/11 1622) Cardiac Rhythm: Normal sinus rhythm (06/11 0746) Resp:  [15-20] 20 (06/11 1622) BP: (112-156)/(54-87) 152/86 (06/11 1622) SpO2:  [92 %-97 %] 96 % (06/11 1622)  Recent Labs  Lab 07/14/23 1816  GLUCAP 108*   Recent Labs  Lab 07/14/23 1821 07/14/23 1852 07/14/23 1852 07/15/23 0503 07/17/23 0309 07/18/23 0313  NA 138 138  --  138 140 139  K 3.8 3.8  --  3.5 4.1 4.1  CL 104 105  --  105 106 108  CO2  --  21*  --  24 25 24   GLUCOSE 109* 108*  --  95 105* 89  BUN 23 23  --  18 22 19   CREATININE 0.90 0.84  --  0.88 0.92 0.85  CALCIUM  --  9.5   < > 9.2 9.0 8.7*  MG  --   --   --   --   --  1.8   < > = values in this interval not displayed.   Recent Labs  Lab 07/14/23 1852 07/15/23 0503 07/18/23 0313  AST 19 19 19   ALT 19 17 19   ALKPHOS 89 84 85  BILITOT 0.8 0.8 1.0  PROT 6.5 5.9* 5.8*  ALBUMIN 3.8 3.4* 3.0*   Recent Labs  Lab 07/14/23 1821 07/14/23 1852 07/15/23 0503 07/17/23 0309 07/18/23 0313  WBC  --  8.6 6.5 7.0 6.6  NEUTROABS  --  6.0  --  4.1  --   HGB 13.3 13.5 12.6 12.7 11.9*  HCT 39.0 39.5 37.6 37.9 35.3*  MCV  --  88.0 87.9 88.6 87.8  PLT  --  319 297 303 270   No results for input(s): CKTOTAL, CKMB, CKMBINDEX, TROPONINI in the last 168 hours. No results for input(s): LABPROT, INR in the last 72 hours.  No results for input(s): COLORURINE, LABSPEC, PHURINE, GLUCOSEU, HGBUR, BILIRUBINUR, KETONESUR, PROTEINUR, UROBILINOGEN, NITRITE, LEUKOCYTESUR in the last 72 hours.  Invalid input(s): APPERANCEUR     Component Value Date/Time   CHOL 156 07/15/2023 0503   TRIG 101 07/15/2023 0503   HDL 40 (L) 07/15/2023 0503   CHOLHDL 3.9 07/15/2023  0503   VLDL 20 07/15/2023 0503   LDLCALC 96 07/15/2023 0503   Lab Results  Component Value Date   HGBA1C 5.3 07/15/2023      Component Value Date/Time   LABOPIA NONE DETECTED 07/14/2023 1942   COCAINSCRNUR NONE DETECTED 07/14/2023 1942   LABBENZ NONE DETECTED 07/14/2023 1942   AMPHETMU NONE DETECTED 07/14/2023 1942   THCU NONE DETECTED 07/14/2023 1942   LABBARB NONE DETECTED 07/14/2023 1942    Recent Labs  Lab 07/14/23 1852  ETH <15    I have personally reviewed the radiological images below and agree with the radiology interpretations.  ECHOCARDIOGRAM COMPLETE BUBBLE STUDY Result Date: 07/16/2023    ECHOCARDIOGRAM REPORT   Patient Name:   Kathryn Cameron Date of Exam: 07/16/2023 Medical Rec #:  [id number]      Height:       63.0 in Accession #:    [id number]     Weight:       184.7 lb Date of Birth:  09/04/1955      BSA:  1.870 m Patient Age:    67 years       BP:           106/82 mmHg Patient Gender: F              HR:           77 bpm. Exam Location:  Inpatient Procedure: 2D Echo, Cardiac Doppler, Color Doppler and Saline Contrast Bubble            Study (Both Spectral and Color Flow Doppler were utilized during            procedure). Indications:    Stroke  History:        Patient has no prior history of Echocardiogram examinations.                 Stroke; Risk Factors:Sleep Apnea, Dyslipidemia and Hypertension.  Sonographer:    Raynelle Callow RDCS Referring Phys: [id number] [facility]  Sonographer Comments: Technically difficult study due to poor echo windows. IMPRESSIONS  1. Left ventricular ejection fraction, by estimation, is 65 to 70%. The left ventricle has normal function. The left ventricle has no regional wall motion abnormalities. Left ventricular diastolic parameters are consistent with Grade I diastolic dysfunction (impaired relaxation).  2. Right ventricular systolic function was not well visualized. The right ventricular size is normal.  3. The mitral valve is normal in  structure. No evidence of mitral valve regurgitation. No evidence of mitral stenosis.  4. The aortic valve is calcified. Aortic valve regurgitation is moderate. No aortic stenosis is present.  5. Aortic dilatation noted. There is dilatation of the ascending aorta, measuring 42 mm.  6. Agitated saline contrast bubble study was negative, with no evidence of any interatrial shunt. FINDINGS  Left Ventricle: Left ventricular ejection fraction, by estimation, is 65 to 70%. The left ventricle has normal function. The left ventricle has no regional wall motion abnormalities. The left ventricular internal cavity size was normal in size. There is  no left ventricular hypertrophy. Left ventricular diastolic parameters are consistent with Grade I diastolic dysfunction (impaired relaxation). Right Ventricle: The right ventricular size is normal. No increase in right ventricular wall thickness. Right ventricular systolic function was not well visualized. Left Atrium: Left atrial size was normal in size. Right Atrium: Right atrial size was normal in size. Pericardium: There is no evidence of pericardial effusion. Mitral Valve: The mitral valve is normal in structure. No evidence of mitral valve regurgitation. No evidence of mitral valve stenosis. Tricuspid Valve: The tricuspid valve is normal in structure. Tricuspid valve regurgitation is not demonstrated. No evidence of tricuspid stenosis. Aortic Valve: The aortic valve is calcified. Aortic valve regurgitation is moderate. No aortic stenosis is present. Aortic valve mean gradient measures 8.0 mmHg. Aortic valve peak gradient measures 14.6 mmHg. Aortic valve area, by VTI measures 4.00 cm. Pulmonic Valve: The pulmonic valve was normal in structure. Pulmonic valve regurgitation is trivial. No evidence of pulmonic stenosis. Aorta: Aortic dilatation noted. There is dilatation of the ascending aorta, measuring 42 mm. IAS/Shunts: No atrial level shunt detected by color flow Doppler.  Agitated saline contrast was given intravenously to evaluate for intracardiac shunting. Agitated saline contrast bubble study was negative, with no evidence of any interatrial shunt.  LEFT VENTRICLE PLAX 2D LVIDd:         4.90 cm     Diastology LVIDs:         3.50 cm     LV e' medial:    5.11  cm/s LV PW:         1.10 cm     LV E/e' medial:  14.3 LV IVS:        1.20 cm     LV e' lateral:   7.72 cm/s LVOT diam:     2.70 cm     LV E/e' lateral: 9.5 LV SV:         147 LV SV Index:   79 LVOT Area:     5.73 cm  LV Volumes (MOD) LV vol d, MOD A2C: 60.6 ml LV vol d, MOD A4C: 54.6 ml LV vol s, MOD A2C: 13.8 ml LV vol s, MOD A4C: 21.0 ml LV SV MOD A2C:     46.8 ml LV SV MOD A4C:     54.6 ml LV SV MOD BP:      40.1 ml RIGHT VENTRICLE             IVC RV S prime:     10.60 cm/s  IVC diam: 2.10 cm TAPSE (M-mode): 1.2 cm LEFT ATRIUM             Index        RIGHT ATRIUM          Index LA diam:        1.90 cm 1.02 cm/m   RA Area:     9.10 cm LA Vol (A2C):   30.3 ml 16.21 ml/m  RA Volume:   16.30 ml 8.72 ml/m LA Vol (A4C):   39.6 ml 21.18 ml/m LA Biplane Vol: 34.6 ml 18.51 ml/m  AORTIC VALVE AV Area (Vmax):    3.42 cm AV Area (Vmean):   3.44 cm AV Area (VTI):     4.00 cm AV Vmax:           191.00 cm/s AV Vmean:          128.000 cm/s AV VTI:            0.368 m AV Peak Grad:      14.6 mmHg AV Mean Grad:      8.0 mmHg LVOT Vmax:         114.00 cm/s LVOT Vmean:        77.000 cm/s LVOT VTI:          0.257 m LVOT/AV VTI ratio: 0.70  AORTA Ao Root diam: 3.80 cm Ao Asc diam:  4.20 cm MITRAL VALVE MV Area (PHT): 3.08 cm    SHUNTS MV Decel Time: 246 msec    Systemic VTI:  0.26 m MV E velocity: 73.20 cm/s  Systemic Diam: 2.70 cm MV A velocity: 85.20 cm/s MV E/A ratio:  0.86 Kathryn Cameron Electronically signed by Kathryn Cameron Signature Date/Time: 07/16/2023/5:28:23 PM    Final    VAS US  LOWER EXTREMITY VENOUS (DVT) Result Date: 07/16/2023  Lower Venous DVT Study Patient Name:  Kathryn Cameron  Date of Exam:   07/16/2023 Medical Rec  #: [id number]       Accession #:    [id number] Date of Birth: 1955/09/27       Patient Gender: F Patient Age:   45 years Exam Location:  [facility] Procedure:      VAS US  LOWER EXTREMITY VENOUS (DVT) Referring Phys: Fraser Jackson Don Giarrusso --------------------------------------------------------------------------------  Indications: Stroke.  Risk Factors: None identified. Comparison Study: No prior studies. Performing Technologist: Lerry Ransom RVT  Examination Guidelines: A complete evaluation includes B-mode imaging, spectral Doppler, color Doppler, and power Doppler as needed of all  accessible portions of each vessel. Bilateral testing is considered an integral part of a complete examination. Limited examinations for reoccurring indications may be performed as noted. The reflux portion of the exam is performed with the patient in reverse Trendelenburg.  +---------+---------------+---------+-----------+----------+--------------+ RIGHT    CompressibilityPhasicitySpontaneityPropertiesThrombus Aging +---------+---------------+---------+-----------+----------+--------------+ CFV      Full           Yes      Yes                                 +---------+---------------+---------+-----------+----------+--------------+ SFJ      Full                                                        +---------+---------------+---------+-----------+----------+--------------+ FV Prox  Full                                                        +---------+---------------+---------+-----------+----------+--------------+ FV Mid   Full                                                        +---------+---------------+---------+-----------+----------+--------------+ FV DistalFull                                                        +---------+---------------+---------+-----------+----------+--------------+ PFV      Full                                                         +---------+---------------+---------+-----------+----------+--------------+ POP      Full           Yes      Yes                                 +---------+---------------+---------+-----------+----------+--------------+ PTV      Full                                                        +---------+---------------+---------+-----------+----------+--------------+ PERO     Full                                                        +---------+---------------+---------+-----------+----------+--------------+   +---------+---------------+---------+-----------+----------+--------------+ LEFT  CompressibilityPhasicitySpontaneityPropertiesThrombus Aging +---------+---------------+---------+-----------+----------+--------------+ CFV      Full           Yes      Yes                                 +---------+---------------+---------+-----------+----------+--------------+ SFJ      Full                                                        +---------+---------------+---------+-----------+----------+--------------+ FV Prox  Full                                                        +---------+---------------+---------+-----------+----------+--------------+ FV Mid   Full                                                        +---------+---------------+---------+-----------+----------+--------------+ FV DistalFull                                                        +---------+---------------+---------+-----------+----------+--------------+ PFV      Full                                                        +---------+---------------+---------+-----------+----------+--------------+ POP      Full           Yes      Yes                                 +---------+---------------+---------+-----------+----------+--------------+ PTV      Full                                                         +---------+---------------+---------+-----------+----------+--------------+ PERO     Full                                                        +---------+---------------+---------+-----------+----------+--------------+     Summary: RIGHT: - There is no evidence of deep vein thrombosis in the lower extremity.  - No cystic structure found in the popliteal fossa.  LEFT: - There is no evidence of deep vein thrombosis in the lower extremity.  - No cystic structure found in the  popliteal fossa.  *See table(s) above for measurements and observations. Electronically signed by Irvin Mantel on 07/16/2023 at 4:49:10 PM.    Final    MR BRAIN WO CONTRAST Result Date: 07/15/2023 CLINICAL DATA:  68 year old female code stroke presentation yesterday. EXAM: MRI HEAD WITHOUT CONTRAST TECHNIQUE: Multiplanar, multiecho pulse sequences of the brain and surrounding structures were obtained without intravenous contrast. COMPARISON:  CT head, CTA head and neck yesterday. FINDINGS: Brain: Multifocal patchy and confluent restricted diffusion in the right hemisphere, ranging from small areas of cortical and white matter involvement at the right insula to more confluent involvement along the right posterior operculum, 2 a 4 cm area of confluent involvement at the anterior right occipital lobe from the periventricular white matter to the lateral occipital cortex (series 3, image 29). T2 and FLAIR hyperintense cytotoxic edema in the affected areas. No convincing hemorrhagic transformation. No significant intracranial mass effect. No other diffusion restriction. No midline shift, mass effect, evidence of mass lesion, ventriculomegaly, extra-axial collection or acute intracranial hemorrhage. Cervicomedullary junction and pituitary are within normal limits. Scattered mild for age nonspecific cerebral white matter T2 and FLAIR hyperintensity elsewhere. No chronic cortical encephalomalacia or chronic cerebral blood products identified.  Small chronic lacunar infarct of the right caudate nucleus. Deep gray nuclei, brainstem, cerebellum otherwise appear negative. Vascular: Major intracranial vascular flow voids are preserved, dominant distal right vertebral artery as seen by CTA. Skull and upper cervical spine: Normal for age. Visualized bone marrow signal is within normal limits. Sinuses/Orbits: Negative. Paranasal Visualized paranasal sinuses and mastoids are stable and well aerated. Other: Visible internal auditory structures appear normal. IMPRESSION: 1. Multifocal Acute infarcts scattered in the middle Right MCA division, at the Right MCA/PCA watershed area. No hemorrhagic transformation or mass effect. 2. Mild for age underlying white matter disease, small chronic lacunar infarct in the right caudate nucleus. Electronically Signed   By: Marlise Simpers M.D.   On: 07/15/2023 09:58   CT ANGIO HEAD NECK W WO CM Result Date: 07/14/2023 CLINICAL DATA:  Acute ischemic attack EXAM: CT ANGIOGRAPHY HEAD AND NECK WITH AND WITHOUT CONTRAST TECHNIQUE: Multidetector CT imaging of the head and neck was performed using the standard protocol during bolus administration of intravenous contrast. Multiplanar CT image reconstructions and MIPs were obtained to evaluate the vascular anatomy. Carotid stenosis measurements (when applicable) are obtained utilizing NASCET criteria, using the distal internal carotid diameter as the denominator. RADIATION DOSE REDUCTION: This exam was performed according to the departmental dose-optimization program which includes automated exposure control, adjustment of the mA and/or kV according to patient size and/or use of iterative reconstruction technique. CONTRAST:  75mL OMNIPAQUE  IOHEXOL  350 MG/ML SOLN COMPARISON:  None Available. FINDINGS: CTA NECK FINDINGS Skeleton: No acute abnormality or high grade bony spinal canal stenosis. Other neck: Normal pharynx, larynx and major salivary glands. No cervical lymphadenopathy. Unremarkable  thyroid gland. Upper chest: No pneumothorax or pleural effusion. No nodules or masses. Aortic arch: There is no calcific atherosclerosis of the aortic arch. Conventional 3 vessel aortic branching pattern. RIGHT carotid system: The common carotid artery and carotid bifurcation are normal. There is focal fusiform dilatation of the distal ICA just proximal to the skull base with a maximum diameter of 10 mm. LEFT carotid system: The common carotid artery and carotid bifurcation are normal. The distal ICA is extremely tortuous with dilatation measuring up to 11 mm. There is a fenestration or dissection flap (5:143 and 8:121). Vertebral arteries: Right dominant configuration. Minimal atherosclerosis of both vertebral arteries are widely  patent. CTA HEAD FINDINGS POSTERIOR CIRCULATION: Normal vertebral artery V4 segments. No proximal occlusion of the anterior or inferior cerebellar arteries. Basilar artery is normal. Superior cerebellar arteries are normal. Posterior cerebral arteries are normal. ANTERIOR CIRCULATION: Intracranial internal carotid arteries are normal. Anterior cerebral arteries are normal. Middle cerebral arteries are normal. Venous sinuses: As permitted by contrast timing, patent. Anatomic variants: Patent right P-comm Review of the MIP images confirms the above findings. IMPRESSION: 1. No emergent large vessel occlusion or high-grade stenosis of the intracranial arteries. 2. Fusiform dilatation of the distal left ICA with a fenestration or dissection flap. 3. Focal fusiform dilatation of the distal right ICA just proximal to the skull base with a maximum diameter of 10 mm. 4. No hemodynamically significant stenosis of the cervical carotid or vertebral arteries. Electronically Signed   By: Juanetta Nordmann M.D.   On: 07/14/2023 20:40   CT HEAD CODE STROKE WO CONTRAST Result Date: 07/14/2023 CLINICAL DATA:  Code stroke. Neuro deficit, concern for stroke, right-sided facial droop, confusion, slurred speech.  EXAM: CT HEAD WITHOUT CONTRAST TECHNIQUE: Contiguous axial images were obtained from the base of the skull through the vertex without intravenous contrast. RADIATION DOSE REDUCTION: This exam was performed according to the departmental dose-optimization program which includes automated exposure control, adjustment of the mA and/or kV according to patient size and/or use of iterative reconstruction technique. COMPARISON:  None Available. FINDINGS: Brain: No acute intracranial hemorrhage. Hypoattenuation and loss of gray-white differentiation in the posterior aspect of the right cerebral hemisphere involving the posterior right temporal lobe, lateral right occipital lobe and a portion of the right inferior parietal lobule. Nonspecific hypoattenuation in the periventricular and subcortical white matter favored to reflect chronic microvascular ischemic changes. No significant mass effect or midline shift. The basilar cisterns are patent. Posterior fossa is unremarkable. Ventricles: The ventricles are normal. Vascular: No hyperdense vessel or unexpected calcification. Skull: No acute or aggressive finding. Orbits: Orbits are symmetric. Sinuses: The visualized paranasal sinuses are clear. Other: Mastoid air cells are clear. ASPECTS [facility] Stroke Program Early CT Score) - Ganglionic level infarction (caudate, lentiform nuclei, internal capsule, insula, M1-M3 cortex): 6 - Supraganglionic infarction (M4-M6 cortex): 3 Total score (0-10 with 10 being normal): 9 IMPRESSION: 1. Focal hypoattenuation and loss of gray-white differentiation in the posterior aspect of the right MCA territory concerning for acute infarct. 2. No acute intracranial hemorrhage. 3. Mild chronic microvascular ischemic changes. 4. ASPECTS is 9 These results were communicated to Dr. Doretta Gant At 6:47 pm on 07/14/2023 by text page via the [facility] messaging system. Electronically Signed   By: Denny Flack M.D.   On: 07/14/2023 18:47     PHYSICAL EXAM  Temp:   [98.1 F (36.7 C)-99.1 F (37.3 C)] 98.5 F (36.9 C) (06/11 1622) Pulse Rate:  [61-87] 78 (06/11 1622) Resp:  [15-20] 20 (06/11 1622) BP: (112-156)/(54-87) 152/86 (06/11 1622) SpO2:  [92 %-97 %] 96 % (06/11 1622)  General - Well nourished, well developed, in no apparent distress.  Ophthalmologic - fundi not visualized due to noncooperation.  Cardiovascular - Regular rhythm and rate.  Neuro - awake, alert, eyes open, told me age 54 instead of 69, told me month was May but self corrected to June after questioning. Mild expressive aphasia with frequent paraphasic error but some improvement from yesterday. Able to name and repeat and read but with frequent paraphasic errors. No gaze palsy, tracking bilaterally, visual field full but decreased visual acuity on the left visual field and with left visual  field simultagnosia, PERRL. No facial droop. Tongue midline. Bilateral UEs 5/5, no drift. Bilaterally LEs 5/5, no drift. Sensation symmetrical bilaterally, b/l FTN intact, gait not tested.   ASSESSMENT/PLAN Ms. LUSIA GREIS is a left-handed 68 y.o. female with history of hypertension admitted for confusion, speech difficulty. No TNK given due to outside window.    Stroke:  right MCA multifocal infarct likely embolic secondary to cardioembolic source, concerning for undiagnosed A-fib CT right posterior MCA infarct CT head and neck left ICA distal fenestration versus dissection  MRI  Multifocal Acute infarcts scattered in the middle Right MCA division, at the Right MCA/PCA watershed area.  2D Echo EF 65-70%, no PFO LE venous Doppler no DVT Recommend loop recorder prior to discharge to rule out A-fib LDL 96 HgbA1c 5.3 UDS negative Lovenox  for VTE prophylaxis No antithrombotic prior to admission, now on aspirin 81 mg daily and clopidogrel 75 mg daily DAPT for 3 weeks and then as alone. Patient counseled to be compliant with her antithrombotic medications Ongoing aggressive stroke risk factor  management Therapy recommendations: none Disposition: Home today  Heart palpitation One month ago, pt woke up from sleep had heart palpitation, irregular heartbeat and heart rate 160s, lasted about several hours.  She talked with her PCP and was told  sometimes it happens 2-3 days ago, patient again woke up with sleep had heart palpitation, but heart rate only 120s, however it lasted all day long. Given current embolic stroke, concerning for undiagnosed A-fib, recommend loop recorder prior to discharge to rule out A-fib.  Fusiform aneurysms, chronic CT head and neck Fusiform dilatation of the distal left ICA with a fenestration or dissection flap. Focal fusiform dilatation of the distal right ICA just proximal to the skull base with a maximum diameter of 10 mm. 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    Hypertension Stable Home meds including amlodipine  2.5, lisinopril 20, HCTZ 12.5 Long term BP goal normotensive  Hyperlipidemia Home meds: None LDL 96, goal < 70 Now on Lipitor 40 Continue statin at discharge  Other Stroke Risk Factors Advanced age Obesity, Body mass index is 31.58 kg/m.   Other Active Problems   Hospital day # 4  Neurology will sign off. Please call with questions. Pt will follow up with stroke clinic NP at [facility] in about 4 weeks. Thanks for the consult.    Consuelo Denmark, MD PhD Stroke Neurology 07/18/2023 6:31 PM    To contact Stroke Continuity provider, please refer to [email or web address]. After hours, contact General Neurology

## 2023-07-18 NOTE — Progress Notes (Cosign Needed Addendum)
 Referring Provider(s): Dr. Subrina Sundil, MD  Supervising Physician: Luellen Sages  Patient Status:  [facility] - In-pt  Chief Complaint:  Recent slurred speech, word finding difficulty, right middle cerebral artery region stroke.  Subjective:  Patient alert and laying in bed, calm. She does appear upset with her current condition, and is concerned for sequela or recurrences.  Currently without any significant complaints. Patient denies any fevers, headache, chest pain, SOB, cough, abdominal pain, nausea, vomiting or bleeding.     Allergies: Naproxen  Medications: Prior to Admission medications   Medication Sig Start Date End Date Taking? Authorizing Provider  amLODipine  (NORVASC ) 2.5 MG tablet Take 2.5 mg by mouth daily. 03/06/23 03/05/24 Yes [provider]  lisinopril-hydrochlorothiazide (ZESTORETIC) 20-12.5 MG tablet Take 1 tablet by mouth daily. 03/06/23 03/05/24 Yes [provider]  Vitamin D, Ergocalciferol, (DRISDOL) 1.25 MG (50000 UNIT) CAPS capsule Take 50,000 Units by mouth every Sunday. 06/05/23  Yes [provider]     Vital Signs: BP (!) 148/73 (BP Location: Right Arm)   Pulse 82   Temp 98.1 F (36.7 C) (Oral)   Resp 18   Ht 5' 3 (1.6 m)   Wt 178 lb 4.8 oz (80.9 kg)   SpO2 96%   BMI 31.58 kg/m   Physical Exam Constitutional:      Appearance: Normal appearance.  HENT:     Nose:     Comments: Dried blood at left nare. Patient states it is from nasal canula use.  Cardiovascular:     Rate and Rhythm: Normal rate and regular rhythm.     Pulses: Normal pulses.     Heart sounds: Normal heart sounds.  Pulmonary:     Effort: Pulmonary effort is normal.     Breath sounds: Normal breath sounds.  Musculoskeletal:        General: Normal range of motion.     Cervical back: Normal range of motion.  Skin:    General: Skin is warm and dry.     Comments: Right groin femoral acces site without tenderness, bleeding, firmness or  induration, bruising, bruits or pulsations. Dressing is clean, dry, intact.   Neurological:     Mental Status: She is alert and oriented to person, place, and time.  Psychiatric:        Mood and Affect: Mood normal.        Behavior: Behavior normal.        Thought Content: Thought content normal.        Judgment: Judgment normal.      Labs:  CBC: Recent Labs    07/14/23 1852 07/15/23 0503 07/17/23 0309 07/18/23 0313  WBC 8.6 6.5 7.0 6.6  HGB 13.5 12.6 12.7 11.9*  HCT 39.5 37.6 37.9 35.3*  PLT 319 297 303 270    COAGS: Recent Labs    07/14/23 1852  INR 1.0  APTT 31    BMP: Recent Labs    07/14/23 1852 07/15/23 0503 07/17/23 0309 07/18/23 0313  NA 138 138 140 139  K 3.8 3.5 4.1 4.1  CL 105 105 106 108  CO2 21* 24 25 24   GLUCOSE 108* 95 105* 89  BUN 23 18 22 19   CALCIUM 9.5 9.2 9.0 8.7*  CREATININE 0.84 0.88 0.92 0.85  GFRNONAA >60 >60 >60 >60    LIVER FUNCTION TESTS: Recent Labs    07/14/23 1852 07/15/23 0503 07/18/23 0313  BILITOT 0.8 0.8 1.0  AST 19 19 19   ALT 19 17 19   ALKPHOS  89 84 85  PROT 6.5 5.9* 5.8*  ALBUMIN 3.8 3.4* 3.0*    Assessment and Plan: VSS Patient is alert, oriented to self, place, time, and event. She is noted with minimal word-finding difficulty, able to speak almost at baseline. Right groin access site without concerning findings, supple, non-tender. Patient is ok to discharge per NIR service. Will defer to care team.   Dr. Alvira Josephs recommends follow-up CT angio head and neck in 4-6 months. Order placed for patient to be scheduled as outpatient. NIR to sign off.   Thank you for this interesting consult.  I greatly enjoyed meeting Kathryn Cameron and look forward to participating in their care.   Electronically Signed: Lovena Rubinstein, PA-C 07/18/2023, 8:54 AM     I spent a total of 15 Minutes at the the patient's bedside AND on the patient's hospital floor or unit, greater than 50% of which was  counseling/coordinating care for acute ischemic CVA.

## 2023-07-18 NOTE — Progress Notes (Signed)
 Speech Language Pathology Treatment: Cognitive-Linguistic  Patient Details Name: Kathryn Cameron MRN: [id number] DOB: Sep 21, 1955 Today's Date: 07/18/2023 Time: 1308-6578 SLP Time Calculation (min) (ACUTE ONLY): 20 min  Assessment / Plan / Recommendation Clinical Impression  Patient seen by SLP for skilled treatment focused on aphasia goals. Patient was finishing up with OT session when SLP arrived into room. She told SLP that she has been frustrated and upset by her experience at the hospital, specifically mentioning that she was not offered anything for pain and/or anxiety related to MRI and that she was told she would be getting the loop recorder placed at 12pm today and it is now almost 2pm and she hasn't heard anything. Initially, SLP only observed occasional phonemic paraphasias which patient also noticed. As she continued to discuss her frustrations, SLP observed increase in frequency of phonemic paraphasias, ie: I had to lay stall ('still'). During connected speech in unstructured conversation, patient was inconsistent with awareness to phonemic paraphasias. SLP discussed recommendation for OP SLP to evaluate and provide treatment if needed. Patient is hopeful that she will continue to improve over time but she agreed that having an OP SLP referral would be beneficial. SLP to s/o at this time.   HPI HPI: Kathryn Cameron is a 68 yo female with PMH HTN, OSA, B12 deficiency, vitamin D deficiency who presented after developing slurred speech and confusion.  There was also report of left-sided facial droop left-sided numbness.  She was admitted for stroke workup.  MRI brain showed multiple acute infarcts involving right MCA.      SLP Plan  Discharge SLP treatment due to (comment);All goals met          Recommendations   OP SLP                      PRN Aphasia (R47.01)     Discharge SLP treatment due to (comment);All goals met     Vola Grow  07/18/2023, 3:39 PM

## 2023-07-18 NOTE — TOC Transition Note (Signed)
 Transition of Care (TOC) - Discharge Note Sherin Dingwall RN, BSN Transitions of Care Unit 4E- RN Case Manager See Treatment Team for direct phone #   Patient Details  Name: Kathryn Cameron MRN: [id number] Date of Birth: 06-02-1955  Transition of Care [facility]) CM/SW Contact:  Rox Cope, RN Phone Number: 07/18/2023, 3:37 PM   Clinical Narrative:    Pt stable for transition home today, family to transport home.  Per Neuro note not HH/DME needs noted. Cardiology to place loop recorder prior to discharge today.   Pt to follow up at per AVS instructions.    Final next level of care: Home/Self Care Barriers to Discharge: Barriers Resolved   Patient Goals and CMS Choice Patient states their goals for this hospitalization and ongoing recovery are:: return home   Choice offered to / list presented to : NA      Discharge Placement               Home        Discharge Plan and Services Additional resources added to the After Visit Summary for   In-house Referral: NA Discharge Planning Services: CM Consult Post Acute Care Choice: NA          DME Arranged: N/A DME Agency: NA       HH Arranged: NA HH Agency: NA        Social Drivers of Health (SDOH) Interventions SDOH Screenings   Food Insecurity: No Food Insecurity (07/16/2023)  Housing: Low Risk  (07/16/2023)  Transportation Needs: No Transportation Needs (07/16/2023)  Utilities: Not At Risk (07/16/2023)  Financial Resource Strain: Low Risk  (06/07/2023)   Received from [facility]  Social Connections: Moderately Isolated (07/16/2023)  Tobacco Use: Low Risk  (07/14/2023)     Readmission Risk Interventions    07/18/2023    3:37 PM  Readmission Risk Prevention Plan  Post Dischage Appt Complete  Medication Screening Complete  Transportation Screening Complete

## 2023-07-18 NOTE — Discharge Summary (Addendum)
 Physician Discharge Summary   Patient: Kathryn Cameron MRN: [id number] DOB: 1955-12-19  Admit date:     07/14/2023  Discharge date: 07/18/23  Discharge Physician: Mickle Albe    PCP: Lenell Query, PA-C     Discharge Diagnoses: Principal Problem:   Acute ischemic cerebrovascular accident (CVA) [facility]) Active Problems:   Essential hypertension   Hyperlipidemia   Obstructive sleep apnea  Resolved Problems:   * No resolved hospital problems. [facility] Course: Kathryn Cameron is a 68 yo female with PMH HTN, OSA, B12 deficiency, vitamin D deficiency who presented after developing slurred speech and confusion.  There was also report of left-sided facial droop and left-sided numbness.  She was admitted for stroke workup. MRI brain showed multiple acute infarcts involving right MCA.  CT angio head/neck showed bilateral ICA fusiform dilation. Patient had also mentioned feeling elevated heart rate and palpitations over the past couple months.  She states her rate was as fast as in the 160s when she measured it at home.  She has no known history of arrhythmia or cardiac issues otherwise.  Neurology was consulted. Pt received ASA 81 mg PO daily, Lipitor 40 mg PO daily, Plavix  75 mg PO daily. ASA and plavix  for 21 days (then ASA alone).  IR angiogram 07/17/2023 --> Status post bilateral common carotid and left vertebral artery angiograms.   Right CFA approach.   The entire study is somewhat suboptimal secondary to persistent motion artifact.   Findings.   1. Diffuse moderate fusiform dilatation of the right internal carotid artery in mid to distal one third and to lesser degree the petrous segment.   2.  Moderate fusiform dilatation of the mid cervical left ICA associated with tortuosity and the presence of a focal outpouching  projecting superiorly and somewhat laterally measuring 13.4 mm x 9.8 mm most consistent with a pseudoaneurysm.   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  Cardiology will place the loop recorder prior to discharge on 07/18/2023.   Please note the brain MRI showed Edema was present and is not being treated or monitored - not clinically significant (as the pt showed improvement).  DISCHARGE MEDICATION: Allergies as of 07/18/2023       Reactions   Naproxen Hives        Medication List     TAKE these medications    amLODipine  2.5 MG tablet Commonly known as: NORVASC  Take 2.5 mg by mouth daily.   aspirin  EC 81 MG tablet Take 1 tablet (81 mg total) by mouth daily. Swallow whole. Start taking on: July 19, 2023   atorvastatin  40 MG tablet Commonly known as: LIPITOR Take 1 tablet (40 mg total) by mouth daily. Start taking on: July 19, 2023   clopidogrel  75 MG tablet Commonly known as: PLAVIX  Take 1 tablet (75 mg total) by mouth daily for 17 days. Start taking on: July 19, 2023   lisinopril-hydrochlorothiazide 20-12.5 MG tablet Commonly known as: ZESTORETIC Take 1 tablet by mouth daily.   Vitamin D (Ergocalciferol) 1.25 MG (50000 UNIT) Caps capsule Commonly known as: DRISDOL Take 50,000 Units by mouth every Sunday.        Discharge Exam: Filed Weights   07/14/23 1824 07/14/23 1846 07/17/23 0122  Weight: 83.8 kg 83.8 kg 80.9 kg   Physical Exam HENT:     Head: Normocephalic.     Mouth/Throat:  Mouth: Mucous membranes are moist.  Cardiovascular:     Rate and Rhythm: Normal rate.  Pulmonary:     Effort: Pulmonary effort is normal.  Abdominal:     Palpations: Abdomen is soft.  Musculoskeletal:        General: Normal range of motion.  Skin:    General: Skin is warm.  Neurological:     Mental Status: She is alert. Mental status is at baseline.  Psychiatric:        Mood and Affect: Mood normal.      Condition at discharge: good  The results of significant  diagnostics from this hospitalization (including imaging, microbiology, ancillary and laboratory) are listed below for reference.   Imaging Studies: ECHOCARDIOGRAM COMPLETE BUBBLE STUDY Result Date: 07/16/2023    ECHOCARDIOGRAM REPORT   Patient Name:   Kathryn Cameron Date of Exam: 07/16/2023 Medical Rec #:  [id number]      Height:       63.0 in Accession #:    [id number]     Weight:       184.7 lb Date of Birth:  18-Feb-1955      BSA:          1.870 m Patient Age:    67 years       BP:           106/82 mmHg Patient Gender: F              HR:           77 bpm. Exam Location:  Inpatient Procedure: 2D Echo, Cardiac Doppler, Color Doppler and Saline Contrast Bubble            Study (Both Spectral and Color Flow Doppler were utilized during            procedure). Indications:    Stroke  History:        Patient has no prior history of Echocardiogram examinations.                 Stroke; Risk Factors:Sleep Apnea, Dyslipidemia and Hypertension.  Sonographer:    Raynelle Callow RDCS Referring Phys: [id number] [facility]  Sonographer Comments: Technically difficult study due to poor echo windows. IMPRESSIONS  1. Left ventricular ejection fraction, by estimation, is 65 to 70%. The left ventricle has normal function. The left ventricle has no regional wall motion abnormalities. Left ventricular diastolic parameters are consistent with Grade I diastolic dysfunction (impaired relaxation).  2. Right ventricular systolic function was not well visualized. The right ventricular size is normal.  3. The mitral valve is normal in structure. No evidence of mitral valve regurgitation. No evidence of mitral stenosis.  4. The aortic valve is calcified. Aortic valve regurgitation is moderate. No aortic stenosis is present.  5. Aortic dilatation noted. There is dilatation of the ascending aorta, measuring 42 mm.  6. Agitated saline contrast bubble study was negative, with no evidence of any interatrial shunt. FINDINGS  Left Ventricle: Left  ventricular ejection fraction, by estimation, is 65 to 70%. The left ventricle has normal function. The left ventricle has no regional wall motion abnormalities. The left ventricular internal cavity size was normal in size. There is  no left ventricular hypertrophy. Left ventricular diastolic parameters are consistent with Grade I diastolic dysfunction (impaired relaxation). Right Ventricle: The right ventricular size is normal. No increase in right ventricular wall thickness. Right ventricular systolic function was not well visualized. Left Atrium: Left atrial size was normal in size. Right Atrium:  Right atrial size was normal in size. Pericardium: There is no evidence of pericardial effusion. Mitral Valve: The mitral valve is normal in structure. No evidence of mitral valve regurgitation. No evidence of mitral valve stenosis. Tricuspid Valve: The tricuspid valve is normal in structure. Tricuspid valve regurgitation is not demonstrated. No evidence of tricuspid stenosis. Aortic Valve: The aortic valve is calcified. Aortic valve regurgitation is moderate. No aortic stenosis is present. Aortic valve mean gradient measures 8.0 mmHg. Aortic valve peak gradient measures 14.6 mmHg. Aortic valve area, by VTI measures 4.00 cm. Pulmonic Valve: The pulmonic valve was normal in structure. Pulmonic valve regurgitation is trivial. No evidence of pulmonic stenosis. Aorta: Aortic dilatation noted. There is dilatation of the ascending aorta, measuring 42 mm. IAS/Shunts: No atrial level shunt detected by color flow Doppler. Agitated saline contrast was given intravenously to evaluate for intracardiac shunting. Agitated saline contrast bubble study was negative, with no evidence of any interatrial shunt.  LEFT VENTRICLE PLAX 2D LVIDd:         4.90 cm     Diastology LVIDs:         3.50 cm     LV e' medial:    5.11 cm/s LV PW:         1.10 cm     LV E/e' medial:  14.3 LV IVS:        1.20 cm     LV e' lateral:   7.72 cm/s LVOT diam:      2.70 cm     LV E/e' lateral: 9.5 LV SV:         147 LV SV Index:   79 LVOT Area:     5.73 cm  LV Volumes (MOD) LV vol d, MOD A2C: 60.6 ml LV vol d, MOD A4C: 54.6 ml LV vol s, MOD A2C: 13.8 ml LV vol s, MOD A4C: 21.0 ml LV SV MOD A2C:     46.8 ml LV SV MOD A4C:     54.6 ml LV SV MOD BP:      40.1 ml RIGHT VENTRICLE             IVC RV S prime:     10.60 cm/s  IVC diam: 2.10 cm TAPSE (M-mode): 1.2 cm LEFT ATRIUM             Index        RIGHT ATRIUM          Index LA diam:        1.90 cm 1.02 cm/m   RA Area:     9.10 cm LA Vol (A2C):   30.3 ml 16.21 ml/m  RA Volume:   16.30 ml 8.72 ml/m LA Vol (A4C):   39.6 ml 21.18 ml/m LA Biplane Vol: 34.6 ml 18.51 ml/m  AORTIC VALVE AV Area (Vmax):    3.42 cm AV Area (Vmean):   3.44 cm AV Area (VTI):     4.00 cm AV Vmax:           191.00 cm/s AV Vmean:          128.000 cm/s AV VTI:            0.368 m AV Peak Grad:      14.6 mmHg AV Mean Grad:      8.0 mmHg LVOT Vmax:         114.00 cm/s LVOT Vmean:        77.000 cm/s LVOT VTI:  0.257 m LVOT/AV VTI ratio: 0.70  AORTA Ao Root diam: 3.80 cm Ao Asc diam:  4.20 cm MITRAL VALVE MV Area (PHT): 3.08 cm    SHUNTS MV Decel Time: 246 msec    Systemic VTI:  0.26 m MV E velocity: 73.20 cm/s  Systemic Diam: 2.70 cm MV A velocity: 85.20 cm/s MV E/A ratio:  0.86 Arta Lark Electronically signed by Arta Lark Signature Date/Time: 07/16/2023/5:28:23 PM    Final    VAS US  LOWER EXTREMITY VENOUS (DVT) Result Date: 07/16/2023  Lower Venous DVT Study Patient Name:  MIHIRA TOZZI  Date of Exam:   07/16/2023 Medical Rec #: [id number]       Accession #:    [id number] Date of Birth: April 22, 1955       Patient Gender: F Patient Age:   64 years Exam Location:  [facility] Procedure:      VAS US  LOWER EXTREMITY VENOUS (DVT) Referring Phys: Fraser Jackson XU --------------------------------------------------------------------------------  Indications: Stroke.  Risk Factors: None identified. Comparison Study: No prior studies.  Performing Technologist: Lerry Ransom RVT  Examination Guidelines: A complete evaluation includes B-mode imaging, spectral Doppler, color Doppler, and power Doppler as needed of all accessible portions of each vessel. Bilateral testing is considered an integral part of a complete examination. Limited examinations for reoccurring indications may be performed as noted. The reflux portion of the exam is performed with the patient in reverse Trendelenburg.  +---------+---------------+---------+-----------+----------+--------------+ RIGHT    CompressibilityPhasicitySpontaneityPropertiesThrombus Aging +---------+---------------+---------+-----------+----------+--------------+ CFV      Full           Yes      Yes                                 +---------+---------------+---------+-----------+----------+--------------+ SFJ      Full                                                        +---------+---------------+---------+-----------+----------+--------------+ FV Prox  Full                                                        +---------+---------------+---------+-----------+----------+--------------+ FV Mid   Full                                                        +---------+---------------+---------+-----------+----------+--------------+ FV DistalFull                                                        +---------+---------------+---------+-----------+----------+--------------+ PFV      Full                                                        +---------+---------------+---------+-----------+----------+--------------+  POP      Full           Yes      Yes                                 +---------+---------------+---------+-----------+----------+--------------+ PTV      Full                                                        +---------+---------------+---------+-----------+----------+--------------+ PERO     Full                                                         +---------+---------------+---------+-----------+----------+--------------+   +---------+---------------+---------+-----------+----------+--------------+ LEFT     CompressibilityPhasicitySpontaneityPropertiesThrombus Aging +---------+---------------+---------+-----------+----------+--------------+ CFV      Full           Yes      Yes                                 +---------+---------------+---------+-----------+----------+--------------+ SFJ      Full                                                        +---------+---------------+---------+-----------+----------+--------------+ FV Prox  Full                                                        +---------+---------------+---------+-----------+----------+--------------+ FV Mid   Full                                                        +---------+---------------+---------+-----------+----------+--------------+ FV DistalFull                                                        +---------+---------------+---------+-----------+----------+--------------+ PFV      Full                                                        +---------+---------------+---------+-----------+----------+--------------+ POP      Full           Yes      Yes                                 +---------+---------------+---------+-----------+----------+--------------+  PTV      Full                                                        +---------+---------------+---------+-----------+----------+--------------+ PERO     Full                                                        +---------+---------------+---------+-----------+----------+--------------+     Summary: RIGHT: - There is no evidence of deep vein thrombosis in the lower extremity.  - No cystic structure found in the popliteal fossa.  LEFT: - There is no evidence of deep vein thrombosis in the lower extremity.  - No cystic structure  found in the popliteal fossa.  *See table(s) above for measurements and observations. Electronically signed by Irvin Mantel on 07/16/2023 at 4:49:10 PM.    Final    MR BRAIN WO CONTRAST Result Date: 07/15/2023 CLINICAL DATA:  68 year old female code stroke presentation yesterday. EXAM: MRI HEAD WITHOUT CONTRAST TECHNIQUE: Multiplanar, multiecho pulse sequences of the brain and surrounding structures were obtained without intravenous contrast. COMPARISON:  CT head, CTA head and neck yesterday. FINDINGS: Brain: Multifocal patchy and confluent restricted diffusion in the right hemisphere, ranging from small areas of cortical and white matter involvement at the right insula to more confluent involvement along the right posterior operculum, 2 a 4 cm area of confluent involvement at the anterior right occipital lobe from the periventricular white matter to the lateral occipital cortex (series 3, image 29). T2 and FLAIR hyperintense cytotoxic edema in the affected areas. No convincing hemorrhagic transformation. No significant intracranial mass effect. No other diffusion restriction. No midline shift, mass effect, evidence of mass lesion, ventriculomegaly, extra-axial collection or acute intracranial hemorrhage. Cervicomedullary junction and pituitary are within normal limits. Scattered mild for age nonspecific cerebral white matter T2 and FLAIR hyperintensity elsewhere. No chronic cortical encephalomalacia or chronic cerebral blood products identified. Small chronic lacunar infarct of the right caudate nucleus. Deep gray nuclei, brainstem, cerebellum otherwise appear negative. Vascular: Major intracranial vascular flow voids are preserved, dominant distal right vertebral artery as seen by CTA. Skull and upper cervical spine: Normal for age. Visualized bone marrow signal is within normal limits. Sinuses/Orbits: Negative. Paranasal Visualized paranasal sinuses and mastoids are stable and well aerated. Other: Visible  internal auditory structures appear normal. IMPRESSION: 1. Multifocal Acute infarcts scattered in the middle Right MCA division, at the Right MCA/PCA watershed area. No hemorrhagic transformation or mass effect. 2. Mild for age underlying white matter disease, small chronic lacunar infarct in the right caudate nucleus. Electronically Signed   By: Marlise Simpers M.D.   On: 07/15/2023 09:58   CT ANGIO HEAD NECK W WO CM Result Date: 07/14/2023 CLINICAL DATA:  Acute ischemic attack EXAM: CT ANGIOGRAPHY HEAD AND NECK WITH AND WITHOUT CONTRAST TECHNIQUE: Multidetector CT imaging of the head and neck was performed using the standard protocol during bolus administration of intravenous contrast. Multiplanar CT image reconstructions and MIPs were obtained to evaluate the vascular anatomy. Carotid stenosis measurements (when applicable) are obtained utilizing NASCET criteria, using the distal internal carotid diameter as the denominator. RADIATION DOSE REDUCTION: This exam was performed according to  the departmental dose-optimization program which includes automated exposure control, adjustment of the mA and/or kV according to patient size and/or use of iterative reconstruction technique. CONTRAST:  75mL OMNIPAQUE  IOHEXOL  350 MG/ML SOLN COMPARISON:  None Available. FINDINGS: CTA NECK FINDINGS Skeleton: No acute abnormality or high grade bony spinal canal stenosis. Other neck: Normal pharynx, larynx and major salivary glands. No cervical lymphadenopathy. Unremarkable thyroid gland. Upper chest: No pneumothorax or pleural effusion. No nodules or masses. Aortic arch: There is no calcific atherosclerosis of the aortic arch. Conventional 3 vessel aortic branching pattern. RIGHT carotid system: The common carotid artery and carotid bifurcation are normal. There is focal fusiform dilatation of the distal ICA just proximal to the skull base with a maximum diameter of 10 mm. LEFT carotid system: The common carotid artery and carotid  bifurcation are normal. The distal ICA is extremely tortuous with dilatation measuring up to 11 mm. There is a fenestration or dissection flap (5:143 and 8:121). Vertebral arteries: Right dominant configuration. Minimal atherosclerosis of both vertebral arteries are widely patent. CTA HEAD FINDINGS POSTERIOR CIRCULATION: Normal vertebral artery V4 segments. No proximal occlusion of the anterior or inferior cerebellar arteries. Basilar artery is normal. Superior cerebellar arteries are normal. Posterior cerebral arteries are normal. ANTERIOR CIRCULATION: Intracranial internal carotid arteries are normal. Anterior cerebral arteries are normal. Middle cerebral arteries are normal. Venous sinuses: As permitted by contrast timing, patent. Anatomic variants: Patent right P-comm Review of the MIP images confirms the above findings. IMPRESSION: 1. No emergent large vessel occlusion or high-grade stenosis of the intracranial arteries. 2. Fusiform dilatation of the distal left ICA with a fenestration or dissection flap. 3. Focal fusiform dilatation of the distal right ICA just proximal to the skull base with a maximum diameter of 10 mm. 4. No hemodynamically significant stenosis of the cervical carotid or vertebral arteries. Electronically Signed   By: Juanetta Nordmann M.D.   On: 07/14/2023 20:40   CT HEAD CODE STROKE WO CONTRAST Result Date: 07/14/2023 CLINICAL DATA:  Code stroke. Neuro deficit, concern for stroke, right-sided facial droop, confusion, slurred speech. EXAM: CT HEAD WITHOUT CONTRAST TECHNIQUE: Contiguous axial images were obtained from the base of the skull through the vertex without intravenous contrast. RADIATION DOSE REDUCTION: This exam was performed according to the departmental dose-optimization program which includes automated exposure control, adjustment of the mA and/or kV according to patient size and/or use of iterative reconstruction technique. COMPARISON:  None Available. FINDINGS: Brain: No acute  intracranial hemorrhage. Hypoattenuation and loss of gray-white differentiation in the posterior aspect of the right cerebral hemisphere involving the posterior right temporal lobe, lateral right occipital lobe and a portion of the right inferior parietal lobule. Nonspecific hypoattenuation in the periventricular and subcortical white matter favored to reflect chronic microvascular ischemic changes. No significant mass effect or midline shift. The basilar cisterns are patent. Posterior fossa is unremarkable. Ventricles: The ventricles are normal. Vascular: No hyperdense vessel or unexpected calcification. Skull: No acute or aggressive finding. Orbits: Orbits are symmetric. Sinuses: The visualized paranasal sinuses are clear. Other: Mastoid air cells are clear. ASPECTS [facility] Stroke Program Early CT Score) - Ganglionic level infarction (caudate, lentiform nuclei, internal capsule, insula, M1-M3 cortex): 6 - Supraganglionic infarction (M4-M6 cortex): 3 Total score (0-10 with 10 being normal): 9 IMPRESSION: 1. Focal hypoattenuation and loss of gray-white differentiation in the posterior aspect of the right MCA territory concerning for acute infarct. 2. No acute intracranial hemorrhage. 3. Mild chronic microvascular ischemic changes. 4. ASPECTS is 9 These results were communicated  to Dr. Doretta Gant At 6:47 pm on 07/14/2023 by text page via the [employer] messaging system. Electronically Signed   By: Denny Flack M.D.   On: 07/14/2023 18:47    Microbiology: Results for orders placed or performed during the hospital encounter of 06/23/19  SARS CORONAVIRUS 2 (TAT 6-24 HRS) Nasopharyngeal Nasopharyngeal Swab     Status: None   Collection Time: 06/19/19  1:11 PM   Specimen: Nasopharyngeal Swab  Result Value Ref Range Status   SARS Coronavirus 2 NEGATIVE NEGATIVE Final    Comment: (NOTE) SARS-CoV-2 target nucleic acids are NOT DETECTED. The SARS-CoV-2 RNA is generally detectable in upper and lower respiratory specimens  during the acute phase of infection. Negative results do not preclude SARS-CoV-2 infection, do not rule out co-infections with other pathogens, and should not be used as the sole basis for treatment or other patient management decisions. Negative results must be combined with clinical observations, patient history, and epidemiological information. The expected result is Negative. Fact Sheet for Patients: HairSlick.no Fact Sheet for Healthcare Providers: quierodirigir.com This test is not yet approved or cleared by the [place]  FDA and  has been authorized for detection and/or diagnosis of SARS-CoV-2 by FDA under an Emergency Use Authorization (EUA). This EUA will remain  in effect (meaning this test can be used) for the duration of the COVID-19 declaration under Section 56 4(b)(1) of the Act, 21 U.S.C. section 360bbb-3(b)(1), unless the authorization is terminated or revoked sooner. Performed at [facility], [address] [place], [place] [address]     Labs: CBC: Recent Labs  Lab 07/14/23 1821 07/14/23 1852 07/15/23 0503 07/17/23 0309 07/18/23 0313  WBC  --  8.6 6.5 7.0 6.6  NEUTROABS  --  6.0  --  4.1  --   HGB 13.3 13.5 12.6 12.7 11.9*  HCT 39.0 39.5 37.6 37.9 35.3*  MCV  --  88.0 87.9 88.6 87.8  PLT  --  319 297 303 270   Basic Metabolic Panel: Recent Labs  Lab 07/14/23 1821 07/14/23 1852 07/15/23 0503 07/17/23 0309 07/18/23 0313  NA 138 138 138 140 139  K 3.8 3.8 3.5 4.1 4.1  CL 104 105 105 106 108  CO2  --  21* 24 25 24   GLUCOSE 109* 108* 95 105* 89  BUN 23 23 18 22 19   CREATININE 0.90 0.84 0.88 0.92 0.85  CALCIUM   --  9.5 9.2 9.0 8.7*  MG  --   --   --   --  1.8   Liver Function Tests: Recent Labs  Lab 07/14/23 1852 07/15/23 0503 07/18/23 0313  AST 19 19 19   ALT 19 17 19   ALKPHOS 89 84 85  BILITOT 0.8 0.8 1.0  PROT 6.5 5.9* 5.8*  ALBUMIN 3.8 3.4* 3.0*   CBG: Recent Labs   Lab 07/14/23 1816  GLUCAP 108*    Discharge time spent: greater than 30 minutes.  Signed: Jeret Goyer , MD [facility] 07/18/2023

## 2023-07-18 NOTE — Care Management Important Message (Signed)
 Important Message  Patient Details  Name: Kathryn Cameron MRN: [id number] Date of Birth: [birth year]   Important Message Given:  Yes - Medicare IM     Janith Melnick 07/18/2023, 10:10 AM

## 2023-07-18 NOTE — Progress Notes (Signed)
 Physical Therapy Treatment Patient Details Name: Kathryn Cameron MRN: [id number] DOB: [birth year] Today's Date: 07/18/2023   History of Present Illness Pt is a 68 y/o female presenting with slurred speech. MRI brain showed multifocal acute infarcts scattered in R MCA division. PMH: prior hernia repair, HTN, obstructive sleep apnea, vitamin B12 deficiency and vitamin D deficiency.    PT Comments  Pt seated up in recliner on arrival and agreeable to session with steady progress towards acute goals. Pt continues to demonstrate steady gait without need for AD support, however pt reaching for rail in hall PRN for external support with increased fatigue. Educated pt on fall prevention strategies with pt verbalizing understanding as well as appropriate activity progression and importance of continued mobility. Pt continues to benefit from skilled PT services to progress toward functional mobility goals.    If plan is discharge home, recommend the following: Assist for transportation   Can travel by private vehicle        Equipment Recommendations  None recommended by PT    Recommendations for Other Services       Precautions / Restrictions Precautions Precautions: Other (comment) Recall of Precautions/Restrictions: Intact Precaution/Restrictions Comments: low fall risk Restrictions Weight Bearing Restrictions Per Provider Order: No     Mobility  Bed Mobility Overal bed mobility: Modified Independent             General bed mobility comments: up in chair on arrival    Transfers Overall transfer level: Modified independent Equipment used: None               General transfer comment: use of hands for rise and lower    Ambulation/Gait Ambulation/Gait assistance: Supervision Gait Distance (Feet): 515 Feet Assistive device: None Gait Pattern/deviations: Step-through pattern, Decreased stride length, WFL(Within Functional Limits) Gait velocity: fair     General Gait  Details: no overt LOB, fair pace with no DME.   Stairs Stairs:  (pt declining stair negotiation)           Wheelchair Mobility     Tilt Bed    Modified Rankin (Stroke Patients Only)       Balance Overall balance assessment: Needs assistance Sitting-balance support: Feet supported, No upper extremity supported Sitting balance-Leahy Scale: Good     Standing balance support: During functional activity, No upper extremity supported Standing balance-Leahy Scale: Good                              Communication Communication Communication: Impaired Factors Affecting Communication: Difficulty expressing self;Reduced clarity of speech  Cognition Arousal: Alert Behavior During Therapy: WFL for tasks assessed/performed   PT - Cognitive impairments: No apparent impairments                         Following commands: Intact      Cueing Cueing Techniques: Verbal cues  Exercises      General Comments        Pertinent Vitals/Pain Pain Assessment Pain Assessment: No/denies pain Pain Intervention(s): Monitored during session    Home Living                          Prior Function            PT Goals (current goals can now be found in the care plan section) Acute Rehab PT Goals Patient Stated Goal: get back to normal independent  self PT Goal Formulation: With patient Time For Goal Achievement: 07/30/23 Progress towards PT goals: Progressing toward goals    Frequency    Min 2X/week      PT Plan      Co-evaluation              AM-PAC PT 6 Clicks Mobility   Outcome Measure  Help needed turning from your back to your side while in a flat bed without using bedrails?: None Help needed moving from lying on your back to sitting on the side of a flat bed without using bedrails?: None Help needed moving to and from a bed to a chair (including a wheelchair)?: None Help needed standing up from a chair using your arms  (e.g., wheelchair or bedside chair)?: None Help needed to walk in hospital room?: A Little Help needed climbing 3-5 steps with a railing? : A Little 6 Click Score: 22    End of Session Equipment Utilized During Treatment: Gait belt Activity Tolerance: Patient tolerated treatment well Patient left: with call bell/phone within reach;in chair Nurse Communication: Mobility status PT Visit Diagnosis: Unsteadiness on feet (R26.81);Muscle weakness (generalized) (M62.81);Difficulty in walking, not elsewhere classified (R26.2)     Time: 1006-1020 PT Time Calculation (min) (ACUTE ONLY): 14 min  Charges:    $Gait Training: 8-22 mins PT General Charges $$ ACUTE PT VISIT: 1 Visit                     Alika Eppes R. PTA Acute Rehabilitation Services Office: [phone]   Agapito Horseman 07/18/2023, 11:55 AM

## 2023-07-18 NOTE — Interval H&P Note (Signed)
 History and Physical Interval Note:  07/18/2023 3:49 PM  Kathryn Cameron  has presented today for surgery, with the diagnosis of crypotengic stroke.  The various methods of treatment have been discussed with the patient and family. After consideration of risks, benefits and other options for treatment, the patient has consented to  Procedure(s): LOOP RECORDER INSERTION (N/A) as a surgical intervention.  The patient's history has been reviewed, patient examined, no change in status, stable for surgery.  I have reviewed the patient's chart and labs.  Questions were answered to the patient's satisfaction.     Ardeen Kohler

## 2023-07-18 NOTE — Progress Notes (Signed)
 Occupational Therapy Treatment Patient Details Name: Kathryn Cameron MRN: [id number] DOB: 1955-04-11 Today's Date: 07/18/2023   History of present illness Pt is a 68 y/o female presenting with slurred speech. MRI brain showed multifocal acute infarcts scattered in R MCA division. PMH: prior hernia repair, HTN, obstructive sleep apnea, vitamin B12 deficiency and vitamin D deficiency.   OT comments  Patient treated by Occupational Therapy with all OT Goals met, pt reports symptoms almost completely resolved, and no further acute or post-acute OT needs identified. All education has been completed and the patient has no further questions.  See below for any follow-up Occupational Therapy or equipment needs. OT is signing off. Pt agreeable to this. Thank you for this referral.       If plan is discharge home, recommend the following:      Equipment Recommendations  None recommended by OT    Recommendations for Other Services      Precautions / Restrictions Precautions Precautions: None Recall of Precautions/Restrictions: Intact Restrictions Weight Bearing Restrictions Per Provider Order: No       Mobility Bed Mobility               General bed mobility comments: up in chair on arrival    Transfers Overall transfer level: Independent Equipment used: None                     Balance Overall balance assessment: Modified Independent                                         ADL either performed or assessed with clinical judgement   ADL Overall ADL's : At baseline;Modified independent Eating/Feeding: Independent   Grooming: Independent;Standing   Upper Body Bathing: Modified independent           Lower Body Dressing: Modified independent       Toileting- Clothing Manipulation and Hygiene: Modified independent       Functional mobility during ADLs: Modified independent;Independent General ADL Comments: Per OT goal: Pt ambulated in  room without AD and collected ADL items to simulate home. Pt able to reach into low drawers and change position dynamically with reaching high low or forward for items without LOB or difficulty.    Extremity/Trunk Assessment Upper Extremity Assessment Upper Extremity Assessment: Overall WFL for tasks assessed   Lower Extremity Assessment Lower Extremity Assessment: Overall WFL for tasks assessed   Cervical / Trunk Assessment Cervical / Trunk Assessment: Normal    Vision Patient Visual Report: No change from baseline Vision Assessment?: No apparent visual deficits   Perception     Praxis     Communication Communication Communication: Impaired Factors Affecting Communication: Difficulty expressing self;Reduced clarity of speech   Cognition Arousal: Alert Behavior During Therapy: WFL for tasks assessed/performed Cognition: No apparent impairments             OT - Cognition Comments: Pt reports speech is better and pt able to meet OT Goal of identifying verbally all things in room when OT would point with 6/6 correct and more. Pt also practiced tongue twisters with mild to moderate difficulty but improving with repetition.                 Following commands: Intact        Cueing      Exercises      Shoulder Instructions  General Comments      Pertinent Vitals/ Pain       Pain Assessment Pain Assessment: Faces Faces Pain Scale: No hurt  Home Living         Prior Functioning/Environment       Frequency   (discharge)        Progress Toward Goals  OT Goals(current goals can now be found in the care plan section)  Progress towards OT goals: Goals met/education completed, patient discharged from OT  Acute Rehab OT Goals Patient Stated Goal: Go home now OT Goal Formulation: All assessment and education complete, DC therapy  Plan      Co-evaluation                 AM-PAC OT 6 Clicks Daily Activity     Outcome Measure    Help from another person eating meals?: None Help from another person taking care of personal grooming?: None Help from another person toileting, which includes using toliet, bedpan, or urinal?: None Help from another person bathing (including washing, rinsing, drying)?: None Help from another person to put on and taking off regular upper body clothing?: None Help from another person to put on and taking off regular lower body clothing?: None 6 Click Score: 24    End of Session Equipment Utilized During Treatment:  (None)  OT Visit Diagnosis:  (OT Dx resolved) Symptoms and signs involving cognitive functions: Cerebral infarction   Activity Tolerance Patient tolerated treatment well   Patient Left  (Standing in room with SLP in room)   Nurse Communication          Time: 5284-1324 OT Time Calculation (min): 12 min  Charges: OT General Charges $OT Visit: 1 Visit OT Treatments $Self Care/Home Management : 8-22 mins  [facility], OT Acute Rehab Services Office: [phone] 07/18/2023   Asher Blade 07/18/2023, 2:03 PM

## 2023-07-19 ENCOUNTER — Encounter (HOSPITAL_COMMUNITY): Payer: Self-pay | Admitting: Cardiology

## 2023-08-19 ENCOUNTER — Ambulatory Visit

## 2023-08-19 DIAGNOSIS — I639 Cerebral infarction, unspecified: Secondary | ICD-10-CM | POA: Diagnosis not present

## 2023-08-20 LAB — CUP PACEART REMOTE DEVICE CHECK
Date Time Interrogation Session: 20250713040913
Implantable Pulse Generator Implant Date: 20250611

## 2023-08-26 ENCOUNTER — Ambulatory Visit: Payer: Self-pay | Admitting: Cardiology

## 2023-09-04 ENCOUNTER — Ambulatory Visit (INDEPENDENT_AMBULATORY_CARE_PROVIDER_SITE_OTHER): Admitting: Vascular Surgery

## 2023-09-04 ENCOUNTER — Encounter (INDEPENDENT_AMBULATORY_CARE_PROVIDER_SITE_OTHER): Payer: Self-pay | Admitting: Vascular Surgery

## 2023-09-04 VITALS — BP 136/83 | HR 80 | Wt 177.5 lb

## 2023-09-04 DIAGNOSIS — I10 Essential (primary) hypertension: Secondary | ICD-10-CM | POA: Diagnosis not present

## 2023-09-04 DIAGNOSIS — I639 Cerebral infarction, unspecified: Secondary | ICD-10-CM

## 2023-09-04 DIAGNOSIS — I720 Aneurysm of carotid artery: Secondary | ICD-10-CM | POA: Diagnosis not present

## 2023-09-04 DIAGNOSIS — E785 Hyperlipidemia, unspecified: Secondary | ICD-10-CM

## 2023-09-04 NOTE — Assessment & Plan Note (Addendum)
 blood pressure control important in reducing the progression of atherosclerotic disease and aneurysmal degeneration. On appropriate oral medications.  

## 2023-09-04 NOTE — Assessment & Plan Note (Signed)
 lipid control important in reducing the progression of atherosclerotic disease. Continue statin therapy

## 2023-09-04 NOTE — Progress Notes (Signed)
 Patient ID: Kathryn Cameron, female   DOB: August 17, 1955, 68 y.o.   MRN: [id number]  Chief Complaint  Patient presents with   np. consult. abnormal CTA of head and neck - dilation of IC    HPI Kathryn Cameron is a 68 y.o. female.  I am asked to see the patient by Sherran Berliner for evaluation of carotid artery aneurysms at the base of the skull on each side.  Patient has a previous history of stroke and still has residual aphasia and some other cognitive issues although she has recovered reasonably.  This occurred about 2 months ago when she was actually seen at [facility].  Among her extensive workup at that time included a catheter-based carotid angiogram that I only have the report but cannot find the images to view.  She did have a CT angiogram which I have been able to independently reviewed and is consistent with the findings reported from the angiogram.  She has fusiform dilatation of the right internal carotid artery in the distal cervical segment and at the petrous portion.  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  She has had no recurrent symptoms since her hospitalization almost 2 months ago.  With these findings, she is referred for evaluation by her local neurologist.  She has also seen cardiology.     Past Medical History:  Diagnosis Date   Chicken pox    Hypertension     Past Surgical History:  Procedure Laterality Date   IR ANGIO INTRA EXTRACRAN SEL COM CAROTID INNOMINATE BILAT MOD SED  07/17/2023   IR ANGIO VERTEBRAL SEL SUBCLAVIAN INNOMINATE UNI L MOD SED  07/17/2023   LAPAROSCOPIC HYSTERECTOMY     left one ovary in place   LOOP RECORDER INSERTION N/A 07/18/2023   Procedure: LOOP RECORDER INSERTION;  Surgeon: Kennyth Chew, MD;  Location: [facility] INVASIVE CV LAB;  Service: Cardiovascular;  Laterality: N/A;    XI ROBOTIC ASSISTED INGUINAL HERNIA REPAIR WITH MESH Bilateral 06/23/2019   Procedure: XI ROBOTIC ASSISTED Bilateral Incarcerated INGUINAL HERNIA REPAIR WITH MESH;  Surgeon: Lane Shope, MD;  Location: [facility] ORS;  Service: General;  Laterality: Bilateral;     Family History  Problem Relation Age of Onset   Stroke Father        in his 30's   Hypertension Other        parent   Diabetes Other        parent      Social History   Tobacco Use   Smoking status: Never   Smokeless tobacco: Never  Substance Use Topics   Alcohol use: No    Alcohol/week: 0.0 standard drinks of alcohol   Drug use: No     Allergies  Allergen Reactions   Naproxen Hives    Current Outpatient Medications  Medication Sig Dispense Refill   amLODipine  (NORVASC ) 2.5 MG tablet Take 2.5 mg by mouth daily.     atorvastatin  (LIPITOR) 40 MG tablet Take 1 tablet (40 mg total) by mouth daily. 30 tablet 0   lisinopril-hydrochlorothiazide (ZESTORETIC) 20-12.5 MG tablet Take 1 tablet by mouth daily.     Vitamin D, Ergocalciferol, (DRISDOL) 1.25 MG (50000 UNIT) CAPS capsule Take 50,000 Units by mouth every Sunday.     No current facility-administered medications for this visit.      REVIEW OF SYSTEMS (Negative  unless checked)  Constitutional: [] Weight loss  [] Fever  [] Chills Cardiac: [] Chest pain   [] Chest pressure   [] Palpitations   [] Shortness of breath when laying flat   [] Shortness of breath at rest   [] Shortness of breath with exertion. Vascular:  [] Pain in legs with walking   [] Pain in legs at rest   [] Pain in legs when laying flat   [] Claudication   [] Pain in feet when walking  [] Pain in feet at rest  [] Pain in feet when laying flat   [] History of DVT   [] Phlebitis   [] Swelling in legs   [] Varicose veins   [] Non-healing ulcers Pulmonary:   [] Uses home oxygen   [] Productive cough   [] Hemoptysis   [] Wheeze  [] COPD   [] Asthma Neurologic:  [] Dizziness  [] Blackouts   [] Seizures   [x] History of stroke    [] History of TIA  [] Aphasia   [] Temporary blindness   [] Dysphagia   [] Weakness or numbness in arms   [] Weakness or numbness in legs Musculoskeletal:  [] Arthritis   [] Joint swelling   [] Joint pain   [] Low back pain Hematologic:  [] Easy bruising  [] Easy bleeding   [] Hypercoagulable state   [] Anemic  [] Hepatitis Gastrointestinal:  [] Blood in stool   [] Vomiting blood  [] Gastroesophageal reflux/heartburn   [] Abdominal pain Genitourinary:  [] Chronic kidney disease   [] Difficult urination  [] Frequent urination  [] Burning with urination   [] Hematuria Skin:  [] Rashes   [] Ulcers   [] Wounds Psychological:  [] History of anxiety   []  History of major depression.    Physical Exam BP 136/83   Pulse 80   Wt 177 lb 8 oz (80.5 kg)   BMI 31.44 kg/m  Gen:  WD/WN, NAD Head: [place]/AT, No temporalis wasting.  Ear/Nose/Throat: Hearing grossly intact, nares w/o erythema or drainage, oropharynx w/o Erythema/Exudate Eyes: Conjunctiva clear, sclera non-icteric  Neck: trachea midline.  No JVD.  Pulmonary:  Good air movement, respirations not labored, no use of accessory muscles  Cardiac: RRR, no JVD Vascular:  Vessel Right Left  Radial Palpable Palpable                                   Gastrointestinal:. No masses, surgical incisions, or scars. Musculoskeletal: M/S 5/5 throughout.  Extremities without ischemic changes.  No deformity or atrophy. No edema. Neurologic: Sensation grossly intact in extremities.  Symmetrical.  Speech is fluent. Motor exam as listed above. Psychiatric: Judgment intact, Mood & affect appropriate for pt's clinical situation. Dermatologic: No rashes or ulcers noted.  No cellulitis or open wounds.    Radiology CUP PACEART REMOTE DEVICE CHECK Result Date: 08/20/2023 ILR summary report received. Battery status OK. Normal device function. No new symptom, brady, or pause episodes. No new AF episodes. Monthly summary reports and ROV/PRN 1 tachy event, 6sec in duration, HR 188.  EGM  c/w NCT LA, CVRS   Labs Recent Results (from the past 2160 hours)  Glucose, capillary     Status: Abnormal   Collection Time: 07/14/23  6:16 PM  Result Value Ref Range   Glucose-Capillary 108 (H) 70 - 99 mg/dL    Comment: Glucose reference range applies only to samples taken after fasting for at least 8 hours.  I-stat chem 8, ed     Status: Abnormal   Collection Time: 07/14/23  6:21 PM  Result Value Ref Range   Sodium 138 135 - 145 mmol/L   Potassium 3.8 3.5 - 5.1 mmol/L   Chloride  104 98 - 111 mmol/L   BUN 23 8 - 23 mg/dL   Creatinine, Ser 9.09 0.44 - 1.00 mg/dL   Glucose, Bld 890 (H) 70 - 99 mg/dL    Comment: Glucose reference range applies only to samples taken after fasting for at least 8 hours.   Calcium , Ion 1.17 1.15 - 1.40 mmol/L   TCO2 20 (L) 22 - 32 mmol/L   Hemoglobin 13.3 12.0 - 15.0 g/dL   HCT 60.9 63.9 - 53.9 %  Ethanol     Status: None   Collection Time: 07/14/23  6:52 PM  Result Value Ref Range   Alcohol, Ethyl (B) <15 <15 mg/dL    Comment: (NOTE) For medical purposes only. Performed at [facility], [address] [place], [place] [address]   Protime-INR     Status: None   Collection Time: 07/14/23  6:52 PM  Result Value Ref Range   Prothrombin Time 13.6 11.4 - 15.2 seconds   INR 1.0 0.8 - 1.2    Comment: (NOTE) INR goal varies based on device and disease states. Performed at [facility], [address] [place], [place] [address]   APTT     Status: None   Collection Time: 07/14/23  6:52 PM  Result Value Ref Range   aPTT 31 24 - 36 seconds    Comment: Performed at [facility], [address] [place], [place] [address]  CBC     Status: None   Collection Time: 07/14/23  6:52 PM  Result Value Ref Range   WBC 8.6 4.0 - 10.5 K/uL   RBC 4.49 3.87 - 5.11 MIL/uL   Hemoglobin 13.5 12.0 - 15.0 g/dL   HCT 60.4 63.9 - 53.9 %   MCV 88.0 80.0 - 100.0 fL   MCH 30.1 26.0 - 34.0 pg   MCHC 34.2 30.0 - 36.0 g/dL   RDW 87.0 88.4 - 84.4 %    Platelets 319 150 - 400 K/uL   nRBC 0.0 0.0 - 0.2 %    Comment: Performed at [facility], [address], [place], [place] [address]  Differential     Status: None   Collection Time: 07/14/23  6:52 PM  Result Value Ref Range   Neutrophils Relative % 69 %   Neutro Abs 6.0 1.7 - 7.7 K/uL   Lymphocytes Relative 19 %   Lymphs Abs 1.6 0.7 - 4.0 K/uL   Monocytes Relative 10 %   Monocytes Absolute 0.8 0.1 - 1.0 K/uL   Eosinophils Relative 2 %   Eosinophils Absolute 0.1 0.0 - 0.5 K/uL   Basophils Relative 0 %   Basophils Absolute 0.0 0.0 - 0.1 K/uL   Immature Granulocytes 0 %   Abs Immature Granulocytes 0.01 0.00 - 0.07 K/uL    Comment: Performed at [facility], [address], [place], [place] [address]  Comprehensive metabolic panel     Status: Abnormal   Collection Time: 07/14/23  6:52 PM  Result Value Ref Range   Sodium 138 135 - 145 mmol/L   Potassium 3.8 3.5 - 5.1 mmol/L   Chloride 105 98 - 111 mmol/L   CO2 21 (L) 22 - 32 mmol/L   Glucose, Bld 108 (H) 70 - 99 mg/dL    Comment: Glucose reference range applies only to samples taken after fasting for at least 8 hours.   BUN 23 8 - 23 mg/dL   Creatinine, Ser 9.15 0.44 - 1.00 mg/dL  Calcium  9.5 8.9 - 10.3 mg/dL   Total Protein 6.5 6.5 - 8.1 g/dL   Albumin 3.8 3.5 - 5.0 g/dL   AST 19 15 - 41 U/L   ALT 19 0 - 44 U/L   Alkaline Phosphatase 89 38 - 126 U/L   Total Bilirubin 0.8 0.0 - 1.2 mg/dL   GFR, Estimated >39 >39 mL/min    Comment: (NOTE) Calculated using the CKD-EPI Creatinine Equation (2021)    Anion gap 12 5 - 15    Comment: Performed at [facility], [address], [place], [place] [address]  Urine rapid drug screen (hosp performed)     Status: None   Collection Time: 07/14/23  7:42 PM  Result Value Ref Range   Opiates NONE DETECTED NONE DETECTED   Cocaine NONE DETECTED NONE DETECTED   Benzodiazepines NONE DETECTED NONE DETECTED   Amphetamines NONE DETECTED NONE DETECTED   Tetrahydrocannabinol  NONE DETECTED NONE DETECTED   Barbiturates NONE DETECTED NONE DETECTED    Comment: (NOTE) DRUG SCREEN FOR MEDICAL PURPOSES ONLY.  IF CONFIRMATION IS NEEDED FOR ANY PURPOSE, NOTIFY LAB WITHIN 5 DAYS.  LOWEST DETECTABLE LIMITS FOR URINE DRUG SCREEN Drug Class                     Cutoff (ng/mL) Amphetamine and metabolites    1000 Barbiturate and metabolites    200 Benzodiazepine                 200 Opiates and metabolites        300 Cocaine and metabolites        300 THC                            50 Performed at [facility] Lab, [address] [place], [place] [address]   Hemoglobin A1c     Status: None   Collection Time: 07/15/23  4:51 AM  Result Value Ref Range   Hgb A1c MFr Bld 5.3 4.8 - 5.6 %    Comment: (NOTE)         Prediabetes: 5.7 - 6.4         Diabetes: >6.4         Glycemic control for adults with diabetes: <7.0    Mean Plasma Glucose 105 mg/dL    Comment: (NOTE) Performed At: [facility] [address] [place], [place] [id number] Jennette Shorter MD Ey:[id number]   HIV Antibody (routine testing w rflx)     Status: None   Collection Time: 07/15/23  5:03 AM  Result Value Ref Range   HIV Screen 4th Generation wRfx Non Reactive Non Reactive    Comment: Performed at [facility], [address] [place], [place] [address]  Lipid panel     Status: Abnormal   Collection Time: 07/15/23  5:03 AM  Result Value Ref Range   Cholesterol 156 0 - 200 mg/dL   Triglycerides 898 <849 mg/dL   HDL 40 (L) >59 mg/dL   Total CHOL/HDL Ratio 3.9 RATIO   VLDL 20 0 - 40 mg/dL   LDL Cholesterol 96 0 - 99 mg/dL    Comment:        Total Cholesterol/HDL:CHD Risk Coronary Heart Disease Risk Table                     Men   Women  1/2 Average Risk   3.4  3.3  Average Risk       5.0   4.4  2 X Average Risk   9.6   7.1  3 X Average Risk  23.4   11.0        Use the calculated Patient Ratio above and the CHD Risk Table to determine the patient's CHD Risk.         ATP III CLASSIFICATION (LDL):  <100     mg/dL   Optimal  899-870  mg/dL   Near or Above                    Optimal  130-159  mg/dL   Borderline  839-810  mg/dL   High  >809     mg/dL   Very High Performed at [facility], [address] [place], [place] [address]   CBC     Status: None   Collection Time: 07/15/23  5:03 AM  Result Value Ref Range   WBC 6.5 4.0 - 10.5 K/uL   RBC 4.28 3.87 - 5.11 MIL/uL   Hemoglobin 12.6 12.0 - 15.0 g/dL   HCT 62.3 63.9 - 53.9 %   MCV 87.9 80.0 - 100.0 fL   MCH 29.4 26.0 - 34.0 pg   MCHC 33.5 30.0 - 36.0 g/dL   RDW 86.8 88.4 - 84.4 %   Platelets 297 150 - 400 K/uL   nRBC 0.0 0.0 - 0.2 %    Comment: Performed at [facility], [address], [place], [place] [address]  Comprehensive metabolic panel     Status: Abnormal   Collection Time: 07/15/23  5:03 AM  Result Value Ref Range   Sodium 138 135 - 145 mmol/L   Potassium 3.5 3.5 - 5.1 mmol/L   Chloride 105 98 - 111 mmol/L   CO2 24 22 - 32 mmol/L   Glucose, Bld 95 70 - 99 mg/dL    Comment: Glucose reference range applies only to samples taken after fasting for at least 8 hours.   BUN 18 8 - 23 mg/dL   Creatinine, Ser 9.11 0.44 - 1.00 mg/dL   Calcium  9.2 8.9 - 10.3 mg/dL   Total Protein 5.9 (L) 6.5 - 8.1 g/dL   Albumin 3.4 (L) 3.5 - 5.0 g/dL   AST 19 15 - 41 U/L   ALT 17 0 - 44 U/L   Alkaline Phosphatase 84 38 - 126 U/L   Total Bilirubin 0.8 0.0 - 1.2 mg/dL   GFR, Estimated >39 >39 mL/min    Comment: (NOTE) Calculated using the CKD-EPI Creatinine Equation (2021)    Anion gap 9 5 - 15    Comment: Performed at [facility], [address] [place], [place] [address]  ECHOCARDIOGRAM COMPLETE BUBBLE STUDY     Status: None   Collection Time: 07/16/23  2:04 PM  Result Value Ref Range   Single Plane A2C EF 77.2 %   Single Plane A4C EF 61.5 %   Calc EF 67.9 %   S' Lateral 3.50 cm   AR max vel 3.42 cm2   AV Area VTI 4.00 cm2   AV Mean grad 8.0 mmHg   AV Peak grad 14.6  mmHg   Ao pk vel 1.91 m/s   Area-P 1/2 3.08 cm2   AV Area mean vel 3.44 cm2   Est EF 65 - 70%   CBC with Differential/Platelet     Status: None   Collection Time: 07/17/23  3:09  AM  Result Value Ref Range   WBC 7.0 4.0 - 10.5 K/uL   RBC 4.28 3.87 - 5.11 MIL/uL   Hemoglobin 12.7 12.0 - 15.0 g/dL   HCT 62.0 63.9 - 53.9 %   MCV 88.6 80.0 - 100.0 fL   MCH 29.7 26.0 - 34.0 pg   MCHC 33.5 30.0 - 36.0 g/dL   RDW 86.9 88.4 - 84.4 %   Platelets 303 150 - 400 K/uL   nRBC 0.0 0.0 - 0.2 %   Neutrophils Relative % 58 %   Neutro Abs 4.1 1.7 - 7.7 K/uL   Lymphocytes Relative 27 %   Lymphs Abs 1.9 0.7 - 4.0 K/uL   Monocytes Relative 13 %   Monocytes Absolute 0.9 0.1 - 1.0 K/uL   Eosinophils Relative 2 %   Eosinophils Absolute 0.1 0.0 - 0.5 K/uL   Basophils Relative 0 %   Basophils Absolute 0.0 0.0 - 0.1 K/uL   Immature Granulocytes 0 %   Abs Immature Granulocytes 0.01 0.00 - 0.07 K/uL    Comment: Performed at [facility], [address] [place], [place] [address]  Basic metabolic panel     Status: Abnormal   Collection Time: 07/17/23  3:09 AM  Result Value Ref Range   Sodium 140 135 - 145 mmol/L   Potassium 4.1 3.5 - 5.1 mmol/L   Chloride 106 98 - 111 mmol/L   CO2 25 22 - 32 mmol/L   Glucose, Bld 105 (H) 70 - 99 mg/dL    Comment: Glucose reference range applies only to samples taken after fasting for at least 8 hours.   BUN 22 8 - 23 mg/dL   Creatinine, Ser 9.07 0.44 - 1.00 mg/dL   Calcium  9.0 8.9 - 10.3 mg/dL   GFR, Estimated >39 >39 mL/min    Comment: (NOTE) Calculated using the CKD-EPI Creatinine Equation (2021)    Anion gap 9 5 - 15    Comment: Performed at [facility], [address], [place], [place] [address]  CBC     Status: Abnormal   Collection Time: 07/18/23  3:13 AM  Result Value Ref Range   WBC 6.6 4.0 - 10.5 K/uL   RBC 4.02 3.87 - 5.11 MIL/uL   Hemoglobin 11.9 (L) 12.0 - 15.0 g/dL   HCT 64.6 (L) 63.9 - 53.9 %   MCV 87.8 80.0 - 100.0 fL   MCH 29.6  26.0 - 34.0 pg   MCHC 33.7 30.0 - 36.0 g/dL   RDW 87.0 88.4 - 84.4 %   Platelets 270 150 - 400 K/uL   nRBC 0.0 0.0 - 0.2 %    Comment: Performed at [facility] Lab, [address], [place], [place] [address]  Comprehensive metabolic panel     Status: Abnormal   Collection Time: 07/18/23  3:13 AM  Result Value Ref Range   Sodium 139 135 - 145 mmol/L   Potassium 4.1 3.5 - 5.1 mmol/L   Chloride 108 98 - 111 mmol/L   CO2 24 22 - 32 mmol/L   Glucose, Bld 89 70 - 99 mg/dL    Comment: Glucose reference range applies only to samples taken after fasting for at least 8 hours.   BUN 19 8 - 23 mg/dL   Creatinine, Ser 9.14 0.44 - 1.00 mg/dL   Calcium  8.7 (L) 8.9 - 10.3 mg/dL   Total Protein 5.8 (L) 6.5 - 8.1 g/dL   Albumin 3.0 (L) 3.5 - 5.0 g/dL  AST 19 15 - 41 U/L   ALT 19 0 - 44 U/L   Alkaline Phosphatase 85 38 - 126 U/L   Total Bilirubin 1.0 0.0 - 1.2 mg/dL   GFR, Estimated >39 >39 mL/min    Comment: (NOTE) Calculated using the CKD-EPI Creatinine Equation (2021)    Anion gap 7 5 - 15    Comment: Performed at [facility] Lab, [address] [place], [place] [address]  Magnesium     Status: None   Collection Time: 07/18/23  3:13 AM  Result Value Ref Range   Magnesium 1.8 1.7 - 2.4 mg/dL    Comment: Performed at [facility], [address] [place], [place] [address]  CUP PACEART REMOTE DEVICE CHECK     Status: None   Collection Time: 08/19/23  4:09 AM  Result Value Ref Range   Date Time Interrogation Session [id number]    Pulse Generator Manufacturer MERM    Pulse Gen Model LNQ22 LINQ II    Pulse Gen Serial Number [id number]    Clinic Name [facility]    Implantable Pulse Generator Type ICM/ILR    Implantable Pulse Generator Implant Date [id number]     Assessment/Plan:  Carotid artery aneurysm [facility]) Among her extensive workup at that time included a catheter-based carotid angiogram that I only have the report but cannot find the images to view.  She did have  a CT angiogram which I have been able to independently reviewed and is consistent with the findings reported from the angiogram.  She has fusiform dilatation of the right internal carotid artery in the distal cervical segment and at the petrous portion.  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  I have discussed with the patient that this is outside my typical purview as her disease process extends beyond the base of the skull.  As a vascular surgeon, we generally will address issues in the cervical portion, but her disease process is likely more appropriately treated by a neurosurgeon.  I will make a referral to one of our [facility] neurosurgical colleagues at this point.  Essential hypertension blood pressure control important in reducing the progression of atherosclerotic disease and aneurysmal degeneration. On appropriate oral medications.   Hyperlipidemia lipid control important in reducing the progression of atherosclerotic disease. Continue statin therapy      Selinda Gu 09/04/2023, 6:21 PM   This note was created with Dragon medical transcription system.  Any errors from dictation are unintentional.

## 2023-09-04 NOTE — Assessment & Plan Note (Signed)
 Among her extensive workup at that time included a catheter-based carotid angiogram that I only have the report but cannot find the images to view.  She did have a CT angiogram which I have been able to independently reviewed and is consistent with the findings reported from the angiogram.  She has fusiform dilatation of the right internal carotid artery in the distal cervical segment and at the petrous portion.  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  I have discussed with the patient that this is outside my typical purview as her disease process extends beyond the base of the skull.  As a vascular surgeon, we generally will address issues in the cervical portion, but her disease process is likely more appropriately treated by a neurosurgeon.  I will make a referral to one of our [facility] neurosurgical colleagues at this point.

## 2023-09-06 ENCOUNTER — Encounter: Payer: Self-pay | Admitting: Neuroradiology

## 2023-09-06 ENCOUNTER — Ambulatory Visit: Admitting: Neuroradiology

## 2023-09-06 VITALS — BP 128/73 | HR 70 | Ht 62.0 in | Wt 176.0 lb

## 2023-09-06 DIAGNOSIS — I7771 Dissection of carotid artery: Secondary | ICD-10-CM

## 2023-09-06 DIAGNOSIS — I671 Cerebral aneurysm, nonruptured: Secondary | ICD-10-CM | POA: Diagnosis not present

## 2023-09-06 DIAGNOSIS — I639 Cerebral infarction, unspecified: Secondary | ICD-10-CM

## 2023-09-06 NOTE — Progress Notes (Signed)
 Chief Complaint: Patient was seen in consultation today for  Chief Complaint  Patient presents with   Referral    Pt has no complaints is following up with a referral.   at the request of Dew,Jason S  Referring Physician(s): Dew,Jason S  History of Present Illness: Kathryn Cameron is a 68 y.o. female who suffered a right hemispheric stroke on July 14, 2023.  She is left-handed and presented with speech abnormality and left-sided weakness.  Her brain MRI demonstrates multiple acute infarcts in the right middle cerebral artery distribution.  Many of these are cortical.  Her CT arteriogram demonstrated a chronic appearing dissection of the right internal carotid artery with a fusiform aneurysm without thrombus.  There is a similar chronic dissection of the left internal carotid artery with a fusiform aneurysm and fenestration within the artery.  There was no plaque at the carotid bifurcation and I do not see a stenosis or plaque formation in the innominate artery or common carotid artery.  No intracranial lesion appreciated.  I reviewed the brain MRI study and CT arteriogram in detail.  She also had a catheter arteriogram on July 17, 2023 which showed similar findings.  I also reviewed that study.  She had an echocardiogram which showed normal ejection fraction and no intra-atrial shunt.  She does have some valvular disease but nothing severe.  She had a loop recorder implanted which showed a single episode of narrow complex tachycardia that lasted 6 seconds.  No episodes of atrial fibrillation.  She was treated with clopidogrel  for 30 days and aspirin , now continuing with low-dose aspirin .  Her motor deficits have resolved.  She is having occasional word finding difficulty but her reception is excellent and her speech expression is very good, certainly no functional limitation.    Past Medical History:  Diagnosis Date   Chicken pox    Hypertension     Past Surgical History:   Procedure Laterality Date   IR ANGIO INTRA EXTRACRAN SEL COM CAROTID INNOMINATE BILAT MOD SED  07/17/2023   IR ANGIO VERTEBRAL SEL SUBCLAVIAN INNOMINATE UNI L MOD SED  07/17/2023   LAPAROSCOPIC HYSTERECTOMY     left one ovary in place   LOOP RECORDER INSERTION N/A 07/18/2023   Procedure: LOOP RECORDER INSERTION;  Surgeon: Kennyth Chew, MD;  Location: [facility] INVASIVE CV LAB;  Service: Cardiovascular;  Laterality: N/A;   XI ROBOTIC ASSISTED INGUINAL HERNIA REPAIR WITH MESH Bilateral 06/23/2019   Procedure: XI ROBOTIC ASSISTED Bilateral Incarcerated INGUINAL HERNIA REPAIR WITH MESH;  Surgeon: Lane Shope, MD;  Location: [facility] ORS;  Service: General;  Laterality: Bilateral;    Allergies: Tramadol and Naproxen  Medications: Prior to Admission medications   Medication Sig Start Date End Date Taking? Authorizing Provider  amLODipine  (NORVASC ) 2.5 MG tablet Take 2.5 mg by mouth daily. 03/06/23 03/05/24 Yes [provider]  lisinopril-hydrochlorothiazide (ZESTORETIC) 20-12.5 MG tablet Take 1 tablet by mouth daily. 03/06/23 03/05/24 Yes [provider]  Vitamin D, Ergocalciferol, (DRISDOL) 1.25 MG (50000 UNIT) CAPS capsule Take 50,000 Units by mouth every Sunday. 06/05/23  Yes [provider]  atorvastatin  (LIPITOR) 40 MG tablet Take 1 tablet (40 mg total) by mouth daily. Patient not taking: Reported on 09/06/2023 07/19/23 08/18/23  Sira, Zackery, MD     Family History  Problem Relation Age of Onset   Stroke Father        in his 20's   Hypertension Other        parent   Diabetes Other  parent    Social History   Socioeconomic History   Marital status: Divorced    Spouse name: Not on file   Number of children: Not on file   Years of education: Not on file   Highest education level: Not on file  Occupational History   Not on file  Tobacco Use   Smoking status: Never   Smokeless tobacco: Never  Substance and Sexual Activity   Alcohol use: No     Alcohol/week: 0.0 standard drinks of alcohol   Drug use: No   Sexual activity: Not on file  Other Topics Concern   Not on file  Social History Narrative   Not on file   Social Drivers of Health   Financial Resource Strain: Low Risk  (08/23/2023)   Received from [facility]   Overall Financial Resource Strain (CARDIA)    Difficulty of Paying Living Expenses: Not hard at all  Food Insecurity: No Food Insecurity (08/23/2023)   Received from [facility]   Hunger Vital Sign    Within the past 12 months, you worried that your food would run out before you got the money to buy more.: Never true    Within the past 12 months, the food you bought just didn't last and you didn't have money to get more.: Never true  Transportation Needs: No Transportation Needs (08/23/2023)   Received from [facility] - Transportation    In the past 12 months, has lack of transportation kept you from medical appointments or from getting medications?: No    Lack of Transportation (Non-Medical): No  Physical Activity: Not on file  Stress: Not on file  Social Connections: Moderately Isolated (07/16/2023)   Social Connection and Isolation Panel    Frequency of Communication with Friends and Family: More than three times a week    Frequency of Social Gatherings with Friends and Family: Three times a week    Attends Religious Services: More than 4 times per year    Active Member of Clubs or Organizations: No    Attends Banker Meetings: Never    Marital Status: Divorced    Review of Systems  Respiratory:  Negative for shortness of breath.   Cardiovascular:  Negative for chest pain.  Gastrointestinal:  Negative for blood in stool.  Genitourinary:  Negative for hematuria.    Vital Signs: BP 128/73   Pulse 70   Ht 5' 2 (1.575 m)   Wt 176 lb (79.8 kg)   SpO2 97%   BMI 32.19 kg/m   Physical Exam Neurological:     Comments: She has  noticeable speech hesitancy and occasional paraphasic errors and word finding difficulty.  Her speech reception is completely intact.  No dysarthria.  Face is symmetric.  There is no pronator drift.  Her sensation is symmetric.  There is no inattention.  No ataxia.     Imaging: CUP PACEART REMOTE DEVICE CHECK Result Date: 08/20/2023 ILR summary report received. Battery status OK. Normal device function. No new symptom, brady, or pause episodes. No new AF episodes. Monthly summary reports and ROV/PRN 1 tachy event, 6sec in duration, HR 188.  EGM c/w NCT LA, CVRS   Labs:  CBC: Recent Labs    07/14/23 1852 07/15/23 0503 07/17/23 0309 07/18/23 0313  WBC 8.6 6.5 7.0 6.6  HGB 13.5 12.6 12.7 11.9*  HCT 39.5 37.6 37.9 35.3*  PLT 319 297 303 270    COAGS: Recent  Labs    07/14/23 1852  INR 1.0  APTT 31    BMP: Recent Labs    07/14/23 1852 07/15/23 0503 07/17/23 0309 07/18/23 0313  NA 138 138 140 139  K 3.8 3.5 4.1 4.1  CL 105 105 106 108  CO2 21* 24 25 24   GLUCOSE 108* 95 105* 89  BUN 23 18 22 19   CALCIUM  9.5 9.2 9.0 8.7*  CREATININE 0.84 0.88 0.92 0.85  GFRNONAA >60 >60 >60 >60    LIVER FUNCTION TESTS: Recent Labs    07/14/23 1852 07/15/23 0503 07/18/23 0313  BILITOT 0.8 0.8 1.0  AST 19 19 19   ALT 19 17 19   ALKPHOS 89 84 85  PROT 6.5 5.9* 5.8*  ALBUMIN 3.8 3.4* 3.0*    TUMOR MARKERS: No results for input(s): AFPTM, CEA, CA199, CHROMGRNA in the last 8760 hours.  Assessment:  1.  Multifocal cryptogenic right hemispheric stroke 2.  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  The left actually significantly worse than the right with a fenestration within the artery.  Recommendation:  1.  Continue aspirin  for secondary stroke prevention.  My recommendation is long-term aspirin  therapy unless it must be stopped for hemorrhagic complications.  It can be stopped as needed for medical procedures. 2.  No  specific treatment or follow-up needed for the chronic dissecting aneurysms of the internal carotid arteries. 3.  I have not scheduled routine follow-up but I am glad to see her again at any time as needed.   Electronically Signed: Nancyann LULLA Burns  09/06/2023, 4:55 PM

## 2023-09-19 ENCOUNTER — Encounter

## 2023-10-20 ENCOUNTER — Encounter

## 2023-11-15 NOTE — Progress Notes (Signed)
 Remote Loop Recorder Transmission

## 2023-11-20 ENCOUNTER — Encounter

## 2023-12-21 ENCOUNTER — Encounter

## 2024-01-21 ENCOUNTER — Encounter

## 2024-02-21 ENCOUNTER — Encounter

## 2024-03-23 ENCOUNTER — Encounter
